# Patient Record
Sex: Male | Born: 1935 | Race: White | Hispanic: No | Marital: Married | Smoking: Never smoker
Health system: Southern US, Community
[De-identification: ages and names within clinical notes are randomized; demographics above are authoritative.]

## PROBLEM LIST (undated history)

## (undated) DIAGNOSIS — I639 Cerebral infarction, unspecified: Secondary | ICD-10-CM

---

## 2020-02-25 ENCOUNTER — Encounter (HOSPITAL_COMMUNITY): Payer: Self-pay | Admitting: Emergency Medicine

## 2020-02-25 ENCOUNTER — Emergency Department (HOSPITAL_COMMUNITY): Payer: Medicare Other

## 2020-02-25 ENCOUNTER — Inpatient Hospital Stay (HOSPITAL_COMMUNITY)
Admission: EM | Admit: 2020-02-25 | Discharge: 2020-02-28 | DRG: 177 | Disposition: A | Discharge status: Skilled Nursing Facility | Payer: Medicare Other | Source: Skilled Nursing Facility | Attending: Internal Medicine | Admitting: Internal Medicine

## 2020-02-25 ENCOUNTER — Other Ambulatory Visit: Payer: Self-pay

## 2020-02-25 DIAGNOSIS — Z79899 Other long term (current) drug therapy: Secondary | ICD-10-CM

## 2020-02-25 DIAGNOSIS — R64 Cachexia: Secondary | ICD-10-CM | POA: Diagnosis present

## 2020-02-25 DIAGNOSIS — I10 Essential (primary) hypertension: Secondary | ICD-10-CM | POA: Diagnosis present

## 2020-02-25 DIAGNOSIS — N400 Enlarged prostate without lower urinary tract symptoms: Secondary | ICD-10-CM | POA: Diagnosis present

## 2020-02-25 DIAGNOSIS — R9431 Abnormal electrocardiogram [ECG] [EKG]: Secondary | ICD-10-CM | POA: Diagnosis not present

## 2020-02-25 DIAGNOSIS — R633 Feeding difficulties: Secondary | ICD-10-CM | POA: Diagnosis present

## 2020-02-25 DIAGNOSIS — D649 Anemia, unspecified: Secondary | ICD-10-CM | POA: Diagnosis present

## 2020-02-25 DIAGNOSIS — I69391 Dysphagia following cerebral infarction: Secondary | ICD-10-CM | POA: Diagnosis not present

## 2020-02-25 DIAGNOSIS — R1310 Dysphagia, unspecified: Secondary | ICD-10-CM | POA: Diagnosis present

## 2020-02-25 DIAGNOSIS — J189 Pneumonia, unspecified organism: Secondary | ICD-10-CM | POA: Diagnosis not present

## 2020-02-25 DIAGNOSIS — R54 Age-related physical debility: Secondary | ICD-10-CM | POA: Diagnosis present

## 2020-02-25 DIAGNOSIS — C61 Malignant neoplasm of prostate: Secondary | ICD-10-CM | POA: Diagnosis present

## 2020-02-25 DIAGNOSIS — Z681 Body mass index (BMI) 19 or less, adult: Secondary | ICD-10-CM | POA: Diagnosis not present

## 2020-02-25 DIAGNOSIS — W06XXXA Fall from bed, initial encounter: Secondary | ICD-10-CM | POA: Diagnosis present

## 2020-02-25 DIAGNOSIS — R739 Hyperglycemia, unspecified: Secondary | ICD-10-CM | POA: Diagnosis present

## 2020-02-25 DIAGNOSIS — Z20822 Contact with and (suspected) exposure to covid-19: Secondary | ICD-10-CM | POA: Diagnosis present

## 2020-02-25 DIAGNOSIS — Z951 Presence of aortocoronary bypass graft: Secondary | ICD-10-CM

## 2020-02-25 DIAGNOSIS — A419 Sepsis, unspecified organism: Secondary | ICD-10-CM

## 2020-02-25 DIAGNOSIS — R809 Proteinuria, unspecified: Secondary | ICD-10-CM | POA: Diagnosis present

## 2020-02-25 DIAGNOSIS — J690 Pneumonitis due to inhalation of food and vomit: Principal | ICD-10-CM | POA: Diagnosis present

## 2020-02-25 DIAGNOSIS — E43 Unspecified severe protein-calorie malnutrition: Secondary | ICD-10-CM | POA: Diagnosis present

## 2020-02-25 DIAGNOSIS — I252 Old myocardial infarction: Secondary | ICD-10-CM | POA: Diagnosis not present

## 2020-02-25 DIAGNOSIS — I4891 Unspecified atrial fibrillation: Secondary | ICD-10-CM | POA: Diagnosis present

## 2020-02-25 DIAGNOSIS — I493 Ventricular premature depolarization: Secondary | ICD-10-CM | POA: Diagnosis present

## 2020-02-25 DIAGNOSIS — I2510 Atherosclerotic heart disease of native coronary artery without angina pectoris: Secondary | ICD-10-CM | POA: Diagnosis present

## 2020-02-25 DIAGNOSIS — R531 Weakness: Secondary | ICD-10-CM | POA: Diagnosis not present

## 2020-02-25 DIAGNOSIS — I255 Ischemic cardiomyopathy: Secondary | ICD-10-CM | POA: Diagnosis present

## 2020-02-25 DIAGNOSIS — E785 Hyperlipidemia, unspecified: Secondary | ICD-10-CM | POA: Diagnosis present

## 2020-02-25 DIAGNOSIS — I69351 Hemiplegia and hemiparesis following cerebral infarction affecting right dominant side: Secondary | ICD-10-CM | POA: Diagnosis not present

## 2020-02-25 DIAGNOSIS — Z955 Presence of coronary angioplasty implant and graft: Secondary | ICD-10-CM | POA: Diagnosis not present

## 2020-02-25 DIAGNOSIS — R972 Elevated prostate specific antigen [PSA]: Secondary | ICD-10-CM | POA: Diagnosis present

## 2020-02-25 HISTORY — DX: Cerebral infarction, unspecified: I63.9

## 2020-02-25 LAB — CBC
HCT: 41.3 % (ref 39.0–52.0)
Hemoglobin: 13.4 g/dL (ref 13.0–17.0)
MCH: 33.3 pg (ref 26.0–34.0)
MCHC: 32.4 g/dL (ref 30.0–36.0)
MCV: 102.7 fL — ABNORMAL HIGH (ref 80.0–100.0)
Platelets: 218 10*3/uL (ref 150–400)
RBC: 4.02 MIL/uL — ABNORMAL LOW (ref 4.22–5.81)
RDW: 13 % (ref 11.5–15.5)
WBC: 14.5 10*3/uL — ABNORMAL HIGH (ref 4.0–10.5)
nRBC: 0 % (ref 0.0–0.2)

## 2020-02-25 LAB — BASIC METABOLIC PANEL
Anion gap: 11 (ref 5–15)
BUN: 32 mg/dL — ABNORMAL HIGH (ref 8–23)
CO2: 26 mmol/L (ref 22–32)
Calcium: 9.3 mg/dL (ref 8.9–10.3)
Chloride: 105 mmol/L (ref 98–111)
Creatinine, Ser: 1.15 mg/dL (ref 0.61–1.24)
GFR calc Af Amer: >60 mL/min (ref >60)
GFR calc non Af Amer: 55 mL/min — ABNORMAL LOW (ref >60)
Glucose, Bld: 123 mg/dL — ABNORMAL HIGH (ref 70–99)
Potassium: 4.5 mmol/L (ref 3.5–5.1)
Sodium: 142 mmol/L (ref 135–145)

## 2020-02-25 LAB — CBG MONITORING, ED: Glucose-Capillary: 82 mg/dL (ref 70–99)

## 2020-02-25 LAB — SARS CORONAVIRUS 2 BY RT PCR (HOSPITAL ORDER, PERFORMED IN ~~LOC~~ HOSPITAL LAB): SARS Coronavirus 2: NEGATIVE

## 2020-02-25 LAB — POC OCCULT BLOOD, ED: Fecal Occult Bld: NEGATIVE

## 2020-02-25 LAB — LACTIC ACID, PLASMA: Lactic Acid, Venous: 2.1 mmol/L (ref 0.5–1.9)

## 2020-02-25 MED ORDER — SODIUM CHLORIDE 0.9 % IV BOLUS
500.0000 mL | Freq: Once | INTRAVENOUS | Status: AC
  Administered 2020-02-25: 500 mL via INTRAVENOUS

## 2020-02-25 MED ORDER — LACTATED RINGERS IV BOLUS
500.0000 mL | Freq: Once | INTRAVENOUS | Status: AC
  Administered 2020-02-25: 500 mL via INTRAVENOUS

## 2020-02-25 MED ORDER — SPIRONOLACTONE 12.5 MG HALF TABLET: 12.5000 mg | ORAL_TABLET | Freq: Every day | ORAL | Status: DC

## 2020-02-25 MED ORDER — THIAMINE HCL 100 MG/ML IJ SOLN
100.0000 mg | Freq: Every day | INTRAMUSCULAR | Status: DC
  Administered 2020-02-25 – 2020-02-28 (×4): 100 mg via INTRAVENOUS
  Filled 2020-02-25 (×4): qty 2

## 2020-02-25 MED ORDER — METOPROLOL TARTRATE 25 MG/10 ML ORAL SUSPENSION
12.5000 mg | Freq: Two times a day (BID) | ORAL | Status: DC
  Administered 2020-02-26 – 2020-02-28 (×5): 12.5 mg via ORAL
  Filled 2020-02-25 (×5): qty 10

## 2020-02-25 MED ORDER — ENOXAPARIN SODIUM 40 MG/0.4ML ~~LOC~~ SOLN
40.0000 mg | SUBCUTANEOUS | Status: DC
  Administered 2020-02-25 – 2020-02-27 (×3): 40 mg via SUBCUTANEOUS
  Filled 2020-02-25 (×3): qty 0.4

## 2020-02-25 MED ORDER — SPIRONOLACTONE 12.5 MG HALF TABLET
12.5000 mg | ORAL_TABLET | Freq: Every day | ORAL | Status: DC
  Administered 2020-02-26 – 2020-02-28 (×3): 12.5 mg via ORAL
  Filled 2020-02-25 (×3): qty 1

## 2020-02-25 MED ORDER — SODIUM CHLORIDE 0.9 % IV SOLN: 2.0000 g | INTRAVENOUS | Status: DC

## 2020-02-25 MED ORDER — METOPROLOL SUCCINATE ER 25 MG PO TB24: 25.0000 mg | ORAL_TABLET | Freq: Every day | ORAL | Status: DC

## 2020-02-25 MED ORDER — ADULT MULTIVITAMIN W/MINERALS CH
1.0000 | ORAL_TABLET | Freq: Every day | ORAL | Status: DC
  Administered 2020-02-25 – 2020-02-28 (×4): 1 via ORAL
  Filled 2020-02-25 (×4): qty 1

## 2020-02-25 MED ORDER — ACETAMINOPHEN 650 MG RE SUPP: 650.0000 mg | Freq: Four times a day (QID) | RECTAL | Status: DC | PRN

## 2020-02-25 MED ORDER — SACUBITRIL-VALSARTAN 24-26 MG PO TABS
1.0000 | ORAL_TABLET | Freq: Two times a day (BID) | ORAL | Status: DC
  Administered 2020-02-26 – 2020-02-28 (×5): 1 via ORAL
  Filled 2020-02-25 (×6): qty 1

## 2020-02-25 MED ORDER — ACETAMINOPHEN 325 MG PO TABS
650.0000 mg | ORAL_TABLET | Freq: Four times a day (QID) | ORAL | Status: DC | PRN
  Administered 2020-02-28: 650 mg via ORAL
  Filled 2020-02-25: qty 2

## 2020-02-25 MED ORDER — SACUBITRIL-VALSARTAN 24-26 MG PO TABS: 1.0000 | ORAL_TABLET | Freq: Two times a day (BID) | ORAL | Status: DC

## 2020-02-25 MED ORDER — SODIUM CHLORIDE 0.9 % IV SOLN
500.0000 mg | Freq: Once | INTRAVENOUS | Status: AC
  Administered 2020-02-25: 500 mg via INTRAVENOUS
  Filled 2020-02-25: qty 500

## 2020-02-25 MED ORDER — SODIUM CHLORIDE 0.9 % IV SOLN
3.0000 g | Freq: Once | INTRAVENOUS | Status: AC
  Administered 2020-02-25: 3 g via INTRAVENOUS
  Filled 2020-02-25: qty 8

## 2020-02-25 MED ORDER — INSULIN ASPART 100 UNIT/ML ~~LOC~~ SOLN
0.0000 [IU] | Freq: Three times a day (TID) | SUBCUTANEOUS | Status: DC
  Administered 2020-02-26: 2 [IU] via SUBCUTANEOUS
  Administered 2020-02-27 – 2020-02-28 (×2): 1 [IU] via SUBCUTANEOUS

## 2020-02-25 MED ORDER — FOLIC ACID 5 MG/ML IJ SOLN
1.0000 mg | Freq: Every day | INTRAMUSCULAR | Status: DC
  Administered 2020-02-25 – 2020-02-28 (×4): 1 mg via INTRAVENOUS
  Filled 2020-02-25 (×6): qty 0.2

## 2020-02-25 MED ORDER — SODIUM CHLORIDE 0.9 % IV SOLN
3.0000 g | Freq: Four times a day (QID) | INTRAVENOUS | Status: DC
  Administered 2020-02-25 – 2020-02-28 (×10): 3 g via INTRAVENOUS
  Filled 2020-02-25: qty 8
  Filled 2020-02-25: qty 3
  Filled 2020-02-25: qty 8
  Filled 2020-02-25 (×2): qty 3
  Filled 2020-02-25: qty 8
  Filled 2020-02-25: qty 3
  Filled 2020-02-25 (×2): qty 8
  Filled 2020-02-25: qty 3
  Filled 2020-02-25 (×2): qty 8
  Filled 2020-02-25 (×2): qty 3
  Filled 2020-02-25: qty 8

## 2020-02-25 MED ORDER — DEXTROSE 5 % IV SOLN
250.0000 mg | INTRAVENOUS | Status: DC
  Administered 2020-02-26 – 2020-02-28 (×2): 250 mg via INTRAVENOUS
  Filled 2020-02-25 (×4): qty 250

## 2020-02-25 NOTE — ED Notes (Signed)
Daughter Opal Sidles, would like an update [phone]

## 2020-02-25 NOTE — H&P (Signed)
Date: 02/26/2020               Patient Name:  Stephen Shepherd MRN: [id number]  DOB: [birth year] Age / Sex: 86 y.o., male   PCP: Patient, No Pcp Per         Medical Service: Internal Medicine Teaching Service         Attending Physician: Dr. Sid Falcon, MD    First Contact: Dr. Candie Chroman Pager: [phone]  Second Contact: Dr. 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 He presents to the ED with his daughter who assisted in providing the information as follows.   The patient was in his bed today and slid out of his bed while attempting to stand up. He notes this was due to his weakness and denies dizziness, palpitations, chest pain, loss of consciousness. He notes landing on his knees and denies hitting his head. He denies pain anywhere. He notes this event was unwitnessed. His wife called 32.  The patient notes over the past 6 months he has had generalized weakness. His daughter states the patient was performing his own ADL's 6 weeks ago and used driving as an example of this. He recently started using a walker to assist him in ambulating, which he was not using prior to 6 weeks ago. He also notes worsening of his dysphagia over the past year that is residual from his prior stroke. He has only been able to tolerate thin liquids and ensure drinks. Whenever he tries to eat anything, take his medications, or drink thick  liquids, he has coughing spells and is unable to tolerate them. He notes the cough is chronic and only associated with PO intake. The patient's daughter endorses worsening of the coughing spells. He has also had recent dental work done which has contributed to his inability to eat. He had teeth pulled and was unable to have dentures placed due to his weight loss complicating this procedure. The patient notes he is currently able to swallow saliva without difficulties. He also endorses loss of appetite. He was living in [place] receiving his medical care through [facility]. He recently moved to [place] 2 weeks ago and lives at an independent living facility with his wife.   The patient/his daughter also endorse a 40 lb weight loss of the past year. Per past medical records patient was 132 lbs 12/04/2019, there prior records noted 145 lbs in 2018. The patient's daughter states a full work up was done for the weight loss along with imaging that was found to be negative. In reviewing the patient's notes from [facility], he has a history of untreated adenocarcinoma of the prostate. The patient was offered treatment but declined according to his notes from 11/2019. When the patient was asked about a history of prostate disease or prostate cancer he denied having history either, his daughter then noted that she found in  a note that the patient had a history of adenocarcinoma of the prostate.  Denies any headache, shortness of breath, abdominal pain, nausea/vomiting. His daughter denies any changes in the patient's mental status.  Meds:  Current Meds  Medication Sig  . Cholecalciferol 25 MCG (1000 UT) tablet Take 1,000 Units by mouth daily.   Marland Kitchen ENTRESTO 24-26 MG Take 1 tablet by mouth 2 (two) times daily.  . metoprolol succinate (TOPROL-XL) 25 MG 24 hr tablet Take 25 mg by mouth daily.   . Multiple Vitamins-Minerals (MULTIVITAMIN ADULTS 50+) TABS Take 1 tablet by mouth daily.  . simvastatin (ZOCOR)  40 MG tablet Take 40 mg by mouth daily.   Marland Kitchen spironolactone (ALDACTONE) 25 MG tablet Take 12.5 mg by mouth daily.    Allergies: Allergies as of 02/25/2020  . (No Known Allergies)   PMHx: CAD s/p CABG in 2004, LIMA to LAD, SVG to D1, SVG to RPDA & SVG to ramus -Postoperative exercise sestamibi in 07/2003 showed moderate posterior ischemia and an inferior and posterior scar.  -Recent stress showed a large incomplete inferior infarction extending into the inferolateral wall associated with mild peri-infarction ischemia inferolaterally. Compared to the prior study of 01/2011, done with exercise, the inferior wall appears more infarcted and the posterior ischemia is similar.  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 Minimal likely due to BPH. He is going to see Dr. Barbaraann Rondo next week. 04/03/05: He was seen by Dr. Barbaraann Rondo. He felt exam was normal and there was no enlargement or  nodule. No further eval recommended with him.  05/25/06:eval today was NL,PSA rechecked,last one done was 6.5 ?06/09/07: Has been followed by Dr. Barbaraann Rondo after prostate biopsy showed adenocarcinoma,  they are electing to observe closely with serial PSA levels and exams. No urinary complaints. ?07/28/07: To see Dr. Jon Billings in Rad onc soon. PSA level is slightly lower this year 5.47. 09/12/08: seeing Dr. Jon Billings regularly and denies any symptoms, or change in urine stream, pain, hematuria. ?09/18/09: no symptoms to report, follwoing regularly with Dr. Jon Billings. ?10/27/10: doing well with no symptoms, closely follwoed by urology. ?10/29/11: he is now following up with Dr. Melton Alar in urology. ?06/26/13: will check PSA and he does not wish to do the DRE or follow  up with urology at thistime whi is reasonable given his age and comorbities. 12/25/13: last PSA was 13.98 06/2013. Will repeat this in 06/2014 at his next visit. He does not wish to see Urology again at this time, no symptoms, urinating well. ?06/27/14: he has no symptoms of of urge incontience or dribbling or pain. PSA ordered. He would like to follow this. ?02/04/15: has had PSA 21 and again discussed and defers on Uro follow up. 11/14/15: he would like to hear about the options from urology - will refer, last PSA 21. 11/18/16: I met him today for discussion of his situation at length including the prior diagnosis of Gleason 6 prostate cancer, the fact that he is clinically well 9 years later, the PSA progression with perhaps a 2-3 year doubling time, and the MRI/DRE evidence of some bulk of cancer.  In in truth, there are several paths that we could reasonably consider taking. One could make a case for further observation given  that he is 11 and remains clinically well without a particularly fast doubling time. Alternatively, one could make a case that he has a bulk of cancer and deserves treatment either ADT, radiation, or the combination. We discussed the fact that biopsy, even a limited biopsy of the nodule, could help [place] risk stratify between those 2 very different paths. After a good discussion, he was agreeable to consider this but would prefer to do so in the new year (e.g. early spring). That is a reasonable and quite well informed decision. They will additionally give [place] more information as we can monitor serial DRE as well as PSA. It seems likely that we will pursue a limited biopsy (e.g. 3 cores) subsequent to that visit. He is aware of a small but not 0 risk of metastatic progression during the interim. I provided my contact information to facilitate communication moving forward. It was a good discussion. Patient tells me today he is still thinking on this the last PSA was 23 01/2016 an he did not  want to repeat again today. He plans on calling Dr. Arvilla Meres. 05/17/17: he has not gone back, prefers to see his next PSA. He woud like to do this next visit in 6 months. 11/24/17: he defers on follow up, reviewed that PSA this year is 30. 10/23/19: will take this into consideration as a possible etiology of his wt loss and low energy if other work up is unrevealing.  12/04/19: he agrees to see oncology for this as perhaps nutrition referral will also be covered for his cancer diagnosis.   PSHx s/p left knee arthroscopy 1998 s/p TUR 1980s. s/p cervical spinal surgery for traumatic fracture 08/2012  Family History:  Patient notes not knowing his mother or fathers PMHx.   Social History:  Denies any tobacco use, alcohol use or illicit drug use  Review of Systems: A complete ROS was negative except as per HPI.   Patient's recent weights from [facility] notes  10/23/19 57.6 kg (127 lb)  10/05/19 57.2 kg (126 lb)  09/27/19 59 kg (130 lb)   Physical Exam: Blood pressure 116/90, pulse (!) 114, temperature 98.7 F (37.1 C), temperature source Oral, resp. rate (!) 31, SpO2 95 %. Physical Exam Vitals and nursing note reviewed.  Constitutional:      General: He is awake. He is not in acute distress.    Appearance: He is cachectic. He is ill-appearing. He is not toxic-appearing or diaphoretic.  HENT:     Head: Normocephalic and atraumatic.     Mouth/Throat:     Mouth: Mucous membranes are dry.     Pharynx: No oropharyngeal exudate or posterior oropharyngeal erythema.  Eyes:     Extraocular Movements: Extraocular movements intact.     Pupils: Pupils are equal, round, and reactive to light.     Comments: Coloboma of left eye  Cardiovascular:     Rate and Rhythm: Normal rate. Rhythm irregular.     Heart sounds: No murmur heard.  No friction rub. No gallop.   Pulmonary:     Effort: Pulmonary effort is normal. No respiratory distress.     Breath sounds: Normal breath sounds. No stridor. No  wheezing, rhonchi or rales.  Abdominal:     General: Abdomen is flat. There is no distension.     Tenderness: There is no abdominal tenderness.  Musculoskeletal:        General: No swelling or tenderness.     Right lower leg: No edema.  Left lower leg: No edema.  Skin:    Coloration: Skin is pale.     Findings: No rash.  Neurological:     General: No focal deficit present.     Mental Status: He is alert and oriented to person, place, and time.     Cranial Nerves: No cranial nerve deficit.     Sensory: No sensory deficit.     Motor: Weakness (weakness of right upper and lower extremity 4/5. ) present.  Psychiatric:        Mood and Affect: Mood normal.        Behavior: Behavior normal. Behavior is cooperative.    EKG:  EKG in ED at 11:48 am on 02/25/2020 - Tachycardic, irregular rhythm. p waves present, lengthening PR intervals with PAC's.   No prior EKG for me to interpret, but patient's cardiology office note has interpretation of EKG from 01/04/20 reveals NSR, LAD, PACs, poor R wave progression, PVC, and NSSTWA.   CXR:  IMPRESSION: Right lower lobe opacity concerning for pneumonia. Follow-up radiographs are recommended to ensure resolution.  TTE from 07/27/2019 LVEF 37%, diffusely hypokinetic with regional variation. No LV thrombus. LA mildly dilated. AV tricuspid. Mild/mod Stephen. Trace TR.  Pharm MIBI 10/05/2019: large incomplete inferior infarction extending into the inferolateral wall associated with mild peri-infarction ischemia inferolaterally (posteriorly). The LVEF is 33% with paradoxical septal motion consistent with prior cardiac surgery, as well as diffuse hypokinesis. There is mild LV cavity dilatation at rest. Compared to the prior study of 01/2011, done with exercise, the inferior wall appears more infarcted and the posterior ischemia is similar. The LVEF has decreased (LVEF 45% prior). Today's LVEF may be an underestimate due to gating difficulties related to frequent  ectopy (APC, VPC, Complex VEA: Couplets; Bi/Tri Gemini)    Assessment & Plan by Problem: Principal Problem:   Aspiration pneumonia 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 The patient has had a prior workup for weight loss and was supposed to follow up with oncology, per daughter, his dysphagia has never been worked up. Patient also found to have right lower lobe pneumonia per chest x-ray. Will admit patient for further evaluation of his weakness and dysphagia as well as treatment of his pneumonia.  #Weakness - Patient with worsening weakness over the past 6 months, no longer able to perform his ADL's and started walking with a walker - Cachetic on appearance - Suspected etiology of malnutrition vs worsening of adenocarcinoma of prostate - New diagnosis of pneumonia may also be contributing to  - Speech ordered to evaluate dysphagia - PT/OT ordered to evaluate weakness - Nutrition consult ordered  #Dysphagia - Patient with history of dysphagia since his CVA that he notes has been worsening - Patient notes coughing spells with PO intake - Daughter notes workup has not been done for his dysphagia - Speech consult ordered, appreciate their assistance and will follow up with their recommendations - Suspect may be causing malnutrition contributing to the patient's weakness - Will keep NPO at this time until evaluated by speech, meds will be crushed/converted to PO equivalence  - Multivitamin with  minerals ordered, 1 tab daily, will start once patient is no longer NPO - Thiamine B-1 100 mg IV daily as well as folic acid  1 mg IV daily - VB12 and Folate ordered - Due to NPO status, hypoglycemic protocol ordered  #Suspected Aspiration Pneumonia - Right lower lobe opacity concerning for pneumonia seen on physical exam - Patient denies symptoms of shortness of breath, chest pain, fever, chills. Denies worsening of chronic cough with meals - WBC of 14.5, suspect pneumonia is source of leukocytosis. Will trend with daily CBC's - Lactic acid of 2.1. Suspect combination of dehydration and pneumonia. LR 500 mL ordered. - Blood cultures x 2 ordered - Urinalysis and cultures ordered - Suspect aspiration pneumonia as patient coughs with PO intake.  - Will cover for CAP as well as for anaerobic coverage - Continue Unasyn 3 g IV Q6  Day 1/5 - Azithromycin 500 MG IV once and then azithro 250 mg IV for 4 days, Day 1/5 - Acetaminophen 650 mg tab or suppository ordered for any fever - Dysphagia workup as per above  #Adenocarcinoma of the prostate  - Patient with history of adenocarcinoma of the prostate - Most recent PSA of 30 that has been trending up since 2006. Patient with history of deferring urology follow up. - Patient was supposed to follow up with urology outpatient while in [place], but he never followed up with them nor oncology.  - Denies any urinary symptoms - Patient will need to follow up with urology on an outpatient basis, do not believe in hospital consult is indicated at this time  #Ischemic Cardiomyopathy - History of HFrEF, last EF 37% TEE 07/2019, Hx of NYHA Class III - Denies signs/symptoms of CHF exacerbation - Will continue to monitor fluid status, daily weights and strict I/O's. - Continue home medications of; Entresto 24-26 1 tab BID, will give crushed Metoprolol Succinate XL 25 mg daily switched to oral suspension 12.5 BID due to NPO status Sprionolactone 12.5 mg daily, will administer crushed  #Abnormal EKG - Patient found to have PAC's with LAD on EKG in the ED - Patient noted to  have prior PAC's from previous cardiologist's notes.  - There plan was to up-titrate BB as tolerated during future visits - Patient will need to follow up with cardiology on outpatient basis. - Patient to continue metoprolol while admitted  Hx of CAD - CABG in 2004 - Continue metoprolol 25 mg total daily as per above - Will order aspirin 81 mg daily, simvastatin 40 mg daily after speech evaluation  Hx of HTN - BP of 116/90 - Continue HF meds as per above - Will continue to montior BP and administer additional anti-hypertensives as need be  Hyperglycemia - Glucose of 123 - No Hx of diabetes per our records, last A1C at cardiologist in [place] from 07/21 was 5.8. - Novolog sliding scale ordered for any further episodes of hyperglycemia  Hx of Anemia - Hgb of 13.4, MCV of 102.7 - Due to patient's malnutrition will order VB12 and folate - Will supplement as needed  Diet: NPO, sips with meds only DVT Prophylaxis: Lovenox 40 mg daily Code Status: Full Code  Dispo: Admit patient to Inpatient with expected length of stay greater than 2 midnights.  SignedRiesa Pope, MD 02/26/2020, 12:04 AM  After 5pm on weekdays and 1pm on weekends: On Call pager: [phone]

## 2020-02-25 NOTE — ED Triage Notes (Signed)
Pt to triage via [place] from [facility].  PTAR initially called out for fall.  Pt found on knees at bed in praying position.  Pt with R sided weakness when they got him up but has history of R sided weakness from previous stroke.  Pt alert and oriented and states he didn't fall.  Pt reports generalized weakness that has been gradually getting worse over the past few weeks.  Pt reports difficulty swallowing.  Rhonchi to L upper lung.  BP initially 80/-.  Received NS 500cc.  BP 126/60.  18g L upper arm.

## 2020-02-25 NOTE — ED Provider Notes (Signed)
[facility] EMERGENCY DEPARTMENT Provider Note   CSN: [id number] Arrival date & time: 02/25/20  1148     History Chief Complaint  Patient presents with  . Weakness    [facility] is a 86 y.o. male.  Patient with history of stroke, CAD, heart failure who presents to the ED with weakness, deconditioning.  Patient recently moved down here from [place].  Living in independent living.  Recently had some issues with his lower teeth but has been having trouble with swallowing and aspiration recently.  Had a syncopal type event/was too weak to get up out of bed and slid down to the floor.  Did not hit his head or lose consciousness.  He is not on blood thinners.  Blood pressure was in the eighties systolic with EMS.  Patient was tachycardic as well.  When he arrived to triage blood pressure had improved and patient went to the waiting room.  Temperature 99.4 rectally.  Denies any chest pain, shortness of breath.  No abdominal pain.  States that he is too weak to take care of himself.  The history is provided by the patient.  Weakness Severity:  Moderate Onset quality:  Gradual Timing:  Constant Progression:  Worsening Chronicity:  New Relieved by:  Nothing Worsened by:  Nothing Associated symptoms: cough   Associated symptoms: no abdominal pain, no arthralgias, no chest pain, no dysuria, no fever, no seizures, no shortness of breath and no vomiting        Past Medical History:  Diagnosis Date  . Stroke [facility])     There are no problems to display for this patient.    No family history on file.  Social History   Tobacco Use  . Smoking status: Not on file  Substance Use Topics  . Alcohol use: Not on file  . Drug use: Not on file    Home Medications Prior to Admission medications   Medication Sig Start Date End Date Taking? Authorizing Provider  Cholecalciferol 25 MCG (1000 UT) tablet Take 1,000 Units by mouth daily.    Yes [provider]    ENTRESTO 24-26 MG Take 1 tablet by mouth 2 (two) times daily. 01/10/20  Yes [provider]  metoprolol succinate (TOPROL-XL) 25 MG 24 hr tablet Take 25 mg by mouth daily.  09/08/19  Yes [provider]  Multiple Vitamins-Minerals (MULTIVITAMIN ADULTS 50+) TABS Take 1 tablet by mouth daily.   Yes [provider]  simvastatin (ZOCOR) 40 MG tablet Take 40 mg by mouth daily.  09/08/19  Yes [provider]  spironolactone (ALDACTONE) 25 MG tablet Take 12.5 mg by mouth daily.  01/26/20  Yes [provider]    Allergies    Patient has no known allergies.  Review of Systems   Review of Systems  Constitutional: Positive for activity change, appetite change and fatigue. Negative for chills and fever.  HENT: Negative for ear pain and sore throat.   Eyes: Negative for pain and visual disturbance.  Respiratory: Positive for cough. Negative for shortness of breath.   Cardiovascular: Negative for chest pain and palpitations.  Gastrointestinal: Negative for abdominal pain and vomiting.  Genitourinary: Negative for dysuria and hematuria.  Musculoskeletal: Negative for arthralgias and back pain.  Skin: Negative for color change and rash.  Neurological: Positive for weakness. Negative for seizures and syncope.  All other systems reviewed and are negative.   Physical Exam Updated Vital Signs  ED Triage Vitals  Enc Vitals Group  BP 02/25/20 1152 126/74     Pulse Rate 02/25/20 1152 (!) 108     Resp 02/25/20 1152 14     Temp 02/25/20 1152 97.7 F (36.5 C)     Temp Source 02/25/20 1152 Oral     SpO2 02/25/20 1152 97 %     Weight --      Height --      Head Circumference --      Peak Flow --      Pain Score 02/25/20 1150 0     Pain Loc --      Pain Edu? --      Excl. in [place]? --     Physical Exam Vitals and nursing note reviewed.  Constitutional:      Appearance: He is well-developed. He is cachectic.  HENT:     Head: Normocephalic and  atraumatic.     Mouth/Throat:     Mouth: Mucous membranes are dry.     Comments: Mucous membranes extremely dry Eyes:     Extraocular Movements: Extraocular movements intact.     Conjunctiva/sclera: Conjunctivae normal.     Pupils: Pupils are equal, round, and reactive to light.  Cardiovascular:     Rate and Rhythm: Normal rate and regular rhythm.     Pulses: Normal pulses.     Heart sounds: Normal heart sounds. No murmur heard.   Pulmonary:     Effort: Pulmonary effort is normal. No respiratory distress.     Breath sounds: Normal breath sounds.  Abdominal:     Palpations: Abdomen is soft.     Tenderness: There is no abdominal tenderness.  Genitourinary:    Rectum: Normal.     Comments: Brown stool on exam but does have some dried feces on his legs Musculoskeletal:     Cervical back: Neck supple.  Skin:    General: Skin is warm and dry.     Capillary Refill: Capillary refill takes less than 2 seconds.  Neurological:     General: No focal deficit present.     Mental Status: He is alert. Mental status is at baseline.     Comments: Some right-sided weakness which seems baseline otherwise neurologically intact, normal sensation, normal speech     ED Results / Procedures / Treatments   Labs (all labs ordered are listed, but only abnormal results are displayed) Labs Reviewed  BASIC METABOLIC PANEL - Abnormal; Notable for the following components:      Result Value   Glucose, Bld 123 (*)    BUN 32 (*)    GFR calc non Af Amer 55 (*)    All other components within normal limits  CBC - Abnormal; Notable for the following components:   WBC 14.5 (*)    RBC 4.02 (*)    MCV 102.7 (*)    All other components within normal limits  LACTIC ACID, PLASMA - Abnormal; Notable for the following components:   Lactic Acid, Venous 2.1 (*)    All other components within normal limits  SARS CORONAVIRUS 2 BY RT PCR (HOSPITAL ORDER, [place] [facility])  CULTURE, BLOOD  (ROUTINE X 2)  CULTURE, BLOOD (ROUTINE X 2)  URINE CULTURE  URINALYSIS, ROUTINE W REFLEX MICROSCOPIC  LACTIC ACID, PLASMA  HEPATIC FUNCTION PANEL  POC OCCULT BLOOD, ED    EKG EKG Interpretation  Date/Time:  Sunday February 25 2020 11:48:58 EDT Ventricular Rate:  113 PR Interval:    QRS Duration: 104 QT Interval:  286 QTC  Calculation: 392 R Axis:   93 Text Interpretation: Sinus tachycardia with 1st degree A-V block with Premature supraventricular complexes Rightward axis Minimal voltage criteria for LVH, may be normal variant ( Cornell product ) Confirmed by Lennice Sites [phone]) on 02/25/2020 4:33:43 PM   Radiology DG Chest 2 View  Result Date: 02/25/2020 CLINICAL DATA:  Shortness of breath. EXAM: CHEST - 2 VIEW COMPARISON:  None. FINDINGS: The heart size and mediastinal contours are within normal limits. No pneumothorax or pleural effusion is noted. Sternotomy wires are noted. Hyperinflation of the lungs is noted. Left-sided calcified pleural plaque is noted consistent with prior asbestos exposure. Right lower lobe opacity is noted concerning for pneumonia. The visualized skeletal structures are unremarkable. IMPRESSION: Right lower lobe opacity concerning for pneumonia. Follow-up radiographs are recommended to ensure resolution. Electronically Signed   By: Marijo Conception M.D.   On: 02/25/2020 13:45    Procedures .Critical Care Performed by: Lennice Sites, DO Authorized by: Lennice Sites, DO   Critical care provider statement:    Critical care time (minutes):  45   Critical care was necessary to treat or prevent imminent or life-threatening deterioration of the following conditions:  Sepsis   Critical care was time spent personally by me on the following activities:  Discussions with consultants, evaluation of patient's response to treatment, examination of patient, ordering and performing treatments and interventions, ordering and review of laboratory studies, ordering and  review of radiographic studies, pulse oximetry, re-evaluation of patient's condition, obtaining history from patient or surrogate and review of old charts   (including critical care time)  Medications Ordered in ED Medications  sodium chloride 0.9 % bolus 500 mL (0 mLs Intravenous Stopped 02/25/20 1902)  Ampicillin-Sulbactam (UNASYN) 3 g in sodium chloride 0.9 % 100 mL IVPB (0 g Intravenous Stopped 02/25/20 1902)    ED Course  I have reviewed the triage vital signs and the nursing notes.  Pertinent labs & imaging results that were available during my care of the patient were reviewed by me and considered in my medical decision making (see chart for details).    MDM Rules/Calculators/A&P                          Stephen Shepherd is a 86 year old male with history of stroke, CAD, heart failure who presents the ED with generalized weakness, difficulty with swallowing and concern for aspiration.  Had a syncopal type event today which she slid out of bed.  Did not hit his head or lose consciousness.  Patient not on blood thinners.  Neurologically he is at his baseline.  Vital signs with EMS were significant for hypotension with blood pressure in the eighties, heart rate in the low one hundreds.  He was given 500 cc of IV fluids and by the time that the triage blood pressure had improved and he has been in the waiting room.  Blood work was significant for leukocytosis of 14.  Chest x-ray concerning for right lower lobe pneumonia likely consistent with aspiration given that he has been having trouble with eating and chewing difficulty with swallowing per family.  Could be due to his lower teeth being pulled recently but also there has been some swallowing issues before that.  Overall patient is cachectic, does not appear that he is taking care of himself well.  He lives in independent living.  Recently moved here from [place].  Overall vital signs are stable at this time.  But believe  he would benefit from  admission with IV fluids, IV antibiotics, nutrition and and swallow eval.  Will testing for coronavirus.  Will get remaining lab work including lactic acid, urinalysis, Covid testing.  He is vaccinated.  Neurologically appears stable.  No signs of head trauma.    Patient with lactic acid of 2.1.  Covid test is negative.  Overall suspect sepsis and will need this ruled out as he does have tachycardia and tachypnea with leukocytosis and pneumonia on xray.  He was hypotensive with EMS.  Has been hemodynamically improved here.  Will likely need long-term placement as well.  Patient is not safe to go back to independent living.  Will admit for further care.  This chart was dictated using voice recognition software.  Despite best efforts to proofread,  errors can occur which can change the documentation meaning.    Final Clinical Impression(s) / ED Diagnoses Final diagnoses:  Aspiration pneumonia of right lower lobe, unspecified aspiration pneumonia type ([place])  Sepsis, due to unspecified organism, unspecified whether acute organ dysfunction present [facility])    Rx / DC Orders ED Discharge Orders    None       Lennice Sites, DO 02/25/20 1927

## 2020-02-26 ENCOUNTER — Inpatient Hospital Stay (HOSPITAL_COMMUNITY): Payer: Medicare Other

## 2020-02-26 ENCOUNTER — Encounter (HOSPITAL_COMMUNITY): Payer: Self-pay | Admitting: Internal Medicine

## 2020-02-26 DIAGNOSIS — R531 Weakness: Secondary | ICD-10-CM

## 2020-02-26 DIAGNOSIS — J690 Pneumonitis due to inhalation of food and vomit: Principal | ICD-10-CM

## 2020-02-26 DIAGNOSIS — R1310 Dysphagia, unspecified: Secondary | ICD-10-CM

## 2020-02-26 DIAGNOSIS — I255 Ischemic cardiomyopathy: Secondary | ICD-10-CM

## 2020-02-26 DIAGNOSIS — R9431 Abnormal electrocardiogram [ECG] [EKG]: Secondary | ICD-10-CM

## 2020-02-26 DIAGNOSIS — R739 Hyperglycemia, unspecified: Secondary | ICD-10-CM

## 2020-02-26 DIAGNOSIS — C61 Malignant neoplasm of prostate: Secondary | ICD-10-CM

## 2020-02-26 DIAGNOSIS — I2510 Atherosclerotic heart disease of native coronary artery without angina pectoris: Secondary | ICD-10-CM

## 2020-02-26 DIAGNOSIS — E43 Unspecified severe protein-calorie malnutrition: Secondary | ICD-10-CM | POA: Insufficient documentation

## 2020-02-26 DIAGNOSIS — Z955 Presence of coronary angioplasty implant and graft: Secondary | ICD-10-CM

## 2020-02-26 LAB — COMPREHENSIVE METABOLIC PANEL
ALT: 20 U/L (ref 0–44)
AST: 26 U/L (ref 15–41)
Albumin: 2.3 g/dL — ABNORMAL LOW (ref 3.5–5.0)
Alkaline Phosphatase: 50 U/L (ref 38–126)
Anion gap: 8 (ref 5–15)
BUN: 30 mg/dL — ABNORMAL HIGH (ref 8–23)
CO2: 27 mmol/L (ref 22–32)
Calcium: 8.8 mg/dL — ABNORMAL LOW (ref 8.9–10.3)
Chloride: 107 mmol/L (ref 98–111)
Creatinine, Ser: 0.86 mg/dL (ref 0.61–1.24)
GFR calc Af Amer: >60 mL/min (ref >60)
GFR calc non Af Amer: >60 mL/min (ref >60)
Glucose, Bld: 91 mg/dL (ref 70–99)
Potassium: 4.2 mmol/L (ref 3.5–5.1)
Sodium: 142 mmol/L (ref 135–145)
Total Bilirubin: 1.3 mg/dL — ABNORMAL HIGH (ref 0.3–1.2)
Total Protein: 5.6 g/dL — ABNORMAL LOW (ref 6.5–8.1)

## 2020-02-26 LAB — CBC
HCT: 36.5 % — ABNORMAL LOW (ref 39.0–52.0)
Hemoglobin: 11.6 g/dL — ABNORMAL LOW (ref 13.0–17.0)
MCH: 32.4 pg (ref 26.0–34.0)
MCHC: 31.8 g/dL (ref 30.0–36.0)
MCV: 102 fL — ABNORMAL HIGH (ref 80.0–100.0)
Platelets: 202 10*3/uL (ref 150–400)
RBC: 3.58 MIL/uL — ABNORMAL LOW (ref 4.22–5.81)
RDW: 13 % (ref 11.5–15.5)
WBC: 8.6 10*3/uL (ref 4.0–10.5)
nRBC: 0 % (ref 0.0–0.2)

## 2020-02-26 LAB — URINALYSIS, ROUTINE W REFLEX MICROSCOPIC
Bilirubin Urine: NEGATIVE
Glucose, UA: NEGATIVE mg/dL
Hgb urine dipstick: NEGATIVE
Ketones, ur: 5 mg/dL — AB
Leukocytes,Ua: NEGATIVE
Nitrite: NEGATIVE
Protein, ur: 30 mg/dL — AB
Specific Gravity, Urine: 1.027 (ref 1.005–1.030)
pH: 5 (ref 5.0–8.0)

## 2020-02-26 LAB — URINE CULTURE: Culture: <10000 — AB

## 2020-02-26 LAB — FOLATE: Folate: 65.4 ng/mL (ref >5.9)

## 2020-02-26 LAB — HEPATIC FUNCTION PANEL
ALT: 19 U/L (ref 0–44)
AST: 25 U/L (ref 15–41)
Albumin: 2.4 g/dL — ABNORMAL LOW (ref 3.5–5.0)
Alkaline Phosphatase: 50 U/L (ref 38–126)
Bilirubin, Direct: 0.3 mg/dL — ABNORMAL HIGH (ref 0.0–0.2)
Indirect Bilirubin: 1.2 mg/dL — ABNORMAL HIGH (ref 0.3–0.9)
Total Bilirubin: 1.5 mg/dL — ABNORMAL HIGH (ref 0.3–1.2)
Total Protein: 5.8 g/dL — ABNORMAL LOW (ref 6.5–8.1)

## 2020-02-26 LAB — HEMOGLOBIN A1C
Hgb A1c MFr Bld: 5.4 % (ref 4.8–5.6)
Mean Plasma Glucose: 108.28 mg/dL

## 2020-02-26 LAB — GLUCOSE, CAPILLARY
Glucose-Capillary: 74 mg/dL (ref 70–99)
Glucose-Capillary: 74 mg/dL (ref 70–99)
Glucose-Capillary: 161 mg/dL — ABNORMAL HIGH (ref 70–99)
Glucose-Capillary: 103 mg/dL — ABNORMAL HIGH (ref 70–99)

## 2020-02-26 LAB — LACTIC ACID, PLASMA: Lactic Acid, Venous: 1.4 mmol/L (ref 0.5–1.9)

## 2020-02-26 LAB — VITAMIN B12: Vitamin B-12: 1047 pg/mL — ABNORMAL HIGH (ref 180–914)

## 2020-02-26 MED ORDER — SIMVASTATIN 20 MG PO TABS
40.0000 mg | ORAL_TABLET | Freq: Every day | ORAL | Status: DC
  Administered 2020-02-26 – 2020-02-27 (×2): 40 mg via ORAL
  Filled 2020-02-26 (×2): qty 2

## 2020-02-26 MED ORDER — DEXTROSE IN LACTATED RINGERS 5 % IV SOLN: INTRAVENOUS | Status: AC

## 2020-02-26 MED ORDER — ASPIRIN 81 MG PO CHEW
81.0000 mg | CHEWABLE_TABLET | Freq: Every day | ORAL | Status: DC
  Administered 2020-02-26 – 2020-02-28 (×3): 81 mg via ORAL
  Filled 2020-02-26 (×3): qty 1

## 2020-02-26 MED ORDER — ENSURE ENLIVE PO LIQD
237.0000 mL | Freq: Four times a day (QID) | ORAL | Status: DC
  Administered 2020-02-26 – 2020-02-28 (×6): 237 mL via ORAL
  Filled 2020-02-26 (×2): qty 237

## 2020-02-26 NOTE — Evaluation (Signed)
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  Clinical Impression   Pt presents with generalized weakness, impaired sitting and standing balance with heavy posterior bias, difficulty performing mobility tasks, and decreased activity tolerance vs baseline. Pt to benefit from acute PT to address deficits. Pt required mod-max +2 for bed mobility and transfer to recliner at bedside, pt unable to ambulate this day due to poor tolerance, LE weakness with buckling, and heavy posterior leaning difficult to correct even with multimodal cuing. At baseline, pt reports he lives with wife in ALF, and they both take care of themselves. PT recommending ST-SNF level of care post-acutely to improve pt strength and mobility, and reduce fall risk. PT to progress mobility as tolerated, and will continue to follow acutely.      Follow Up Recommendations SNF;Supervision/Assistance - 24 hour    Equipment Recommendations  None recommended by PT    Recommendations for Other Services       Precautions / Restrictions Precautions Precautions: Fall Restrictions Weight Bearing Restrictions: No      Mobility  Bed Mobility Overal bed mobility: Needs Assistance Bed Mobility: Supine to Sit     Supine to sit: HOB elevated;Max assist;+2 for safety/equipment     General bed mobility comments: Max +2 for trunk elevation and completing LE translation off of bed, pt initiated walking legs to EOB. HOB heavily  elevated  Transfers Overall transfer level: Needs assistance Equipment used: Rolling walker (2 wheeled) Transfers: Sit to/from [facility] Sit to Stand: Mod assist;+2 physical assistance Stand pivot transfers: Mod assist;+2 physical assistance       General transfer comment: Mod +2 for power up, steadying, correcting posterior bias, and slow eccentric lower into recliner.  Ambulation/Gait             General Gait Details: not attempted  Stairs            Wheelchair Mobility    Modified Rankin (Stroke Patients Only)       Balance Overall balance assessment: Needs assistance;History of Falls Sitting-balance support: Single extremity supported;Feet supported Sitting balance-Leahy Scale: Fair       Standing balance-Leahy Scale: Poor Standing balance comment: reliant on external assist                             Pertinent Vitals/Pain Pain Assessment: No/denies pain Pain Score: 0-No pain Faces Pain Scale: Hurts a little bit Pain Location: genealized Pain Descriptors / Indicators: Grimacing Pain Intervention(s): Limited activity within patient's tolerance;Monitored during session;Repositioned    Home Living Family/patient expects to be discharged to:: Assisted living               Home Equipment: Walker - 2 wheels;Bedside commode;Shower seat - built in;Grab bars - tub/shower;Grab bars - toilet      Prior Function Level of Independence: Independent with assistive device(s);Needs assistance   Gait / Transfers Assistance Needed: Uses RW to mobilize  ADL's / Homemaking Assistance Needed: Does his own  bathing/dressing; has meals delivered; he and his wife work together to help each other out        Hand Dominance   Dominant Hand: Left (since CVA 30 years ago, pt reports doing many tasks including eating with L hand)    Extremity/Trunk Assessment   Upper Extremity Assessment Upper Extremity Assessment: Defer to OT  evaluation RUE Deficits / Details: genealized weakness; limited shoulder flexion to @ 60; states he has weakness from previous CVA; able to use functionally RUE Coordination: decreased fine motor;decreased gross motor LUE Deficits / Details: generalized weakness    Lower Extremity Assessment Lower Extremity Assessment: Generalized weakness    Cervical / Trunk Assessment Cervical / Trunk Assessment: Kyphotic;Other exceptions Cervical / Trunk Exceptions: forward head, rounded shoulders. Cachetic appearance  Communication      Cognition Arousal/Alertness: Awake/alert Behavior During Therapy: WFL for tasks assessed/performed Overall Cognitive Status: No family/caregiver present to determine baseline cognitive functioning                                 General Comments: most likely close to baseline; HOH but pleasant and agreeable to mobility.      General Comments General comments (skin integrity, edema, etc.): SpO2 95-96 on RA, DOE 2/4 with mobility    Exercises     Assessment/Plan    PT Assessment Patient needs continued PT services  PT Problem List Decreased strength;Decreased mobility;Decreased activity tolerance;Decreased balance;Decreased knowledge of use of DME;Decreased safety awareness;Decreased coordination       PT Treatment Interventions DME instruction;Therapeutic activities;Gait training;Therapeutic exercise;Patient/family education;Balance training;Functional mobility training;Neuromuscular re-education    PT Goals (Current goals can be found in the Care Plan section)  Acute Rehab PT Goals Patient Stated Goal: to get stronger PT Goal Formulation: With patient Time For Goal Achievement: 03/11/20 Potential to Achieve Goals: Good    Frequency Min 2X/week   Barriers to discharge        Co-evaluation PT/OT/SLP Co-Evaluation/Treatment: Yes Reason for Co-Treatment: To address functional/ADL transfers;For patient/therapist safety PT goals  addressed during session: Mobility/safety with mobility;Balance         AM-PAC PT "6 Clicks" Mobility  Outcome Measure Help needed turning from your back to your side while in a flat bed without using bedrails?: A Lot Help needed moving from lying on your back to sitting on the side of a flat bed without using bedrails?: A Lot Help needed moving to and from a bed to a chair (including a wheelchair)?: Total Help needed standing up from a chair using your arms (e.g., wheelchair or bedside chair)?: A Lot Help needed to walk in hospital room?: A Lot Help needed climbing 3-5 steps with a railing? : Total 6 Click Score: 10    End of Session Equipment Utilized During Treatment: Gait belt Activity Tolerance: Patient limited by fatigue Patient left: in chair;with call bell/phone within reach;with chair alarm set Nurse Communication: Mobility status PT Visit Diagnosis: Other abnormalities of gait and mobility (R26.89);Muscle weakness (generalized) (M62.81)    Time: 3151-7616 PT Time Calculation (min) (ACUTE ONLY): 35 min   Charges:   PT Evaluation $PT Eval Low Complexity: 1 Low          Wilma Michaelson E, PT Acute Rehabilitation Services Pager [phone]  Office [phone]    Anajah Sterbenz D [place] Cookson 02/26/2020, 1:19 PM

## 2020-02-26 NOTE — Progress Notes (Signed)
Modified Barium Swallow Progress Note  Patient Details  Name: Adalbert Alberto MRN: [id number] Date of Birth: 1925/02/28  Today's Date: 02/26/2020  Modified Barium Swallow completed.  Full report located under Chart Review in the Imaging Section.  Brief recommendations include the following:  Clinical Impression  Patient presents with a moderate oropharyngeal dysphagia with structural component secondary to significant curvature of cervical vertebrae and resulting in vertebrae pushing into pharynx and causing narrowing of esophagus. Patient with premature spillage of liquids, decreased base of tongue strength, decreased laryngeal elevation and hyolaryngeal excursion as well as impaired airway closure, resulting in silent and sensed trace aspiration during swallow with thin liquids. Puree solids became briefly lodged in vallecular sinus and slowly transited with subsequent swallows and thin liquid sips. Nectar thick liquids resulted in increased vallecular and pyriform sinus residuals as compared to thin liquids. Patient was able to move portion of aspirate out of trachea and out of laryngeal vestibule when just below vocal cords, with throat clear. Patient cannot perform chin tuck do to prior surgery on cervical vertebrae.   Swallow Evaluation Recommendations       SLP Diet Recommendations: Thin liquid   Liquid Administration via: Cup   Medication Administration: Crushed with puree (liquid if possible)   Supervision: Patient able to self feed   Compensations: Minimize environmental distractions;Slow rate;Small sips/bites;Clear throat intermittently       Oral Care Recommendations: Oral care BID       Sonia Baller, MA, CCC-SLP Speech Therapy [facility] Acute Rehab Pager: [phone]

## 2020-02-26 NOTE — Progress Notes (Signed)
Initial Nutrition Assessment  DOCUMENTATION CODES:   Severe malnutrition in context of chronic illness  INTERVENTION:   Clear liquid diet per SLP, advance per SLP assessment  Ensure Enlive po QID, each supplement provides 350 kcal and 20 grams of protein  Encouraged pt to drink oral nutrition supplements as this will be primary source for kcal and protein on limited clear liquid diet.     NUTRITION DIAGNOSIS:   Severe Malnutrition related to chronic illness (dysphagia, cancer) as evidenced by percent weight loss, severe fat depletion, severe muscle depletion. 15% weight loss x 3 months  GOAL:   Patient will meet greater than or equal to 90% of their needs  MONITOR:   PO intake, Supplement acceptance  REASON FOR ASSESSMENT:   Consult Assessment of nutrition requirement/status  ASSESSMENT:   Pt with PMH of HF due to ischemic cardiomyopathy, CAD with MI s/p CABG, CVA with residual R sided weakness, dysphagia, HTN, and adenocarcinoma of the prostate (refused treatment in the past) who has had worsening weakness and dysphagia over the lst 6 months with weight loss. Pt has declined over last 6 months and no longer able to perform his ADL's. He recently moved from [place]  to [place] and is living in ALF with wife.   Spoke with pt and SLP who was at bedside. Pt confirmed history of weight loss due to poor appetite, problems swallowing and problems chewing due to missing lower teeth. Per SLP pt tolerated liqiuds well but not applesauce. After discussing diets SLP plans to recommend clear liquid diet and avoid full liquids due to issues with additional foods allowed on that diet (grits, applesauce, etc). Pt drinks chocolate boost at home and is willing to drink chocolate ensure while hospitalized. Clear liquid diet is very insufficient in kcal and protein and pt would benefit from oral nutrition supplements.  Pt usual weight in 2018 was 145 lb Per records pt was 132 lb 11/2019  which indicates a 15% weight loss in 3 months.   Medications reviewed and include: folic acid, SSI, MVI with minerals, thiamine Labs reviewed: vit B12: 1047, folate: 65.4    NUTRITION - FOCUSED PHYSICAL EXAM:    Most Recent Value  Orbital Region Severe depletion  Upper Arm Region Severe depletion  Thoracic and Lumbar Region Severe depletion  Buccal Region Severe depletion  Temple Region Severe depletion  Clavicle Bone Region Severe depletion  Clavicle and Acromion Bone Region Severe depletion  Scapular Bone Region Severe depletion  Dorsal Hand Severe depletion  Patellar Region Severe depletion  Anterior Thigh Region Severe depletion  Posterior Calf Region Severe depletion  Edema (RD Assessment) None  Hair Reviewed  Eyes Reviewed  Mouth Reviewed  Skin Reviewed  Nails Reviewed       Diet Order:   Diet Order            Diet clear liquid Room service appropriate? Yes with Assist; Fluid consistency: Thin  Diet effective now                 EDUCATION NEEDS:   No education needs have been identified at this time  Skin:  Skin Assessment: Reviewed RN Assessment  Last BM:  9/6 small/type 1  Height:   Ht Readings from Last 1 Encounters:  02/26/20 '6\' 1"'  (1.854 m)    Weight:   Wt Readings from Last 1 Encounters:  02/26/20 51.1 kg    Ideal Body Weight:  83.6 kg  BMI:  Body mass index is 14.86 kg/m.  Estimated Nutritional Needs:   Kcal:  1550-1800  Protein:  80-100 grams  Fluid:  > 1.6 L/day  Lockie Pares., RD, LDN, CNSC See AMiON for contact information

## 2020-02-26 NOTE — Evaluation (Signed)
Clinical/Bedside Swallow Evaluation Patient Details  Name: Stephen Shepherd MRN: [id number] Date of Birth: 03-Jun-1925  Today's Date: 02/26/2020 Time: SLP Start Time (ACUTE ONLY): 1100 SLP Stop Time (ACUTE ONLY): 1120 SLP Time Calculation (min) (ACUTE ONLY): 20 min  Past Medical History:  Past Medical History:  Diagnosis Date  . Stroke [facility])    Past Surgical History: History reviewed. No pertinent surgical history. 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   Assessment / Plan / Recommendation Clinical Impression  Patient presents with a moderate primary pharyngeal dysphagia resulting in immediate throat clearing/dry coughing and expectoration of puree solids post swallows. Swallow initiation was timely, patient with audible swallows which appeared effortful. Prior to PO's, patient had thick secretions on soft palate which SLP removed during oral care. Patient stated that he has history of coughing out/hocking phlegm in the mornings. Per chart review, patient had CVA 30 years ago for which he has mild residual weakness on right side. SLP Visit Diagnosis: Dysphagia, unspecified (R13.10)    Aspiration Risk  Mild aspiration risk    Diet Recommendation Thin liquid   Liquid Administration via: Cup;Straw Supervision: Patient able to self feed Compensations: Slow rate;Small sips/bites Postural Changes: Seated upright at 90 degrees;Remain upright for at least 30 minutes after po intake    Other  Recommendations Oral Care Recommendations: Oral care BID   Follow up Recommendations Other (comment) (TBD)      Frequency and Duration min 2x/week  1  week       Prognosis Prognosis for Safe Diet Advancement: Good      Swallow Study   General Date of Onset: 02/25/20 HPI: 86 y/o M with a PMHx of HFrEF due to ischemic cardiomyopathy (last EF 37% TTE 07/2019), 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 Type of Study: Bedside Swallow Evaluation Previous Swallow Assessment: N/A Diet Prior to this Study: NPO Temperature Spikes Noted: No Respiratory Status: Room air History of Recent Intubation: No Behavior/Cognition: Alert;Pleasant mood;Cooperative Oral Cavity Assessment: Within Functional Limits Oral Care Completed by SLP: Yes Oral Cavity - Dentition: Dentures, top;Edentulous Vision: Functional for self-feeding Self-Feeding Abilities: Able to feed self Patient Positioning: Upright in chair Baseline Vocal Quality: Low vocal intensity Volitional Cough: Weak Volitional Swallow: Able to elicit    Oral/Motor/Sensory Function Overall Oral Motor/Sensory Function: Mild impairment Facial ROM: Within Functional Limits Facial Symmetry: Within Functional Limits Facial Strength: Within Functional Limits Facial Sensation: Within Functional Limits Lingual ROM: Within Functional Limits Lingual Symmetry: Within Functional Limits Lingual Strength: Reduced Velum: Within Functional Limits Mandible: Within Functional Limits   Ice Chips     Thin Liquid Thin Liquid: Impaired Presentation: Straw;Self Fed Pharyngeal  Phase Impairments: Throat Clearing - Immediate;Cough - Immediate;Cough - Delayed Other Comments: Patient with productive but weak dry cough/throat clear and expectorated thick light yellow/clear secretions when doing so    Nectar Thick     Honey Thick  Puree Puree:  Impaired Pharyngeal Phase Impairments: Throat Clearing - Delayed Other Comments: Patient had throat clearing/dry cough throughout PO intake and with applesauce, he expectorated about 60% along with secretions   Solid     Solid: Not tested      Sonia Baller, MA, [facility] Pager: [phone]

## 2020-02-26 NOTE — Progress Notes (Signed)
Subjective: States that he is hungry and and thirsty on NPO diet.  He reports that he has not eaten or drinking anything in 2 days.  Patient reports no other pain this a.m.  Objective:  Vital signs in last 24 hours: Vitals:   02/26/20 0105 02/26/20 0114 02/26/20 0742 02/26/20 1132  BP: 99/72  108/65   Pulse: 67  69   Resp: 18     Temp: 97.9 F (36.6 C)  98.4 F (36.9 C)   TempSrc: Oral     SpO2: 97%  98%   Weight:  51.1 kg    Height:    6\' 1"  (1.854 m)   Constitutional:      General: He is awake. He is not in acute distress.    Appearance: He is cachectic with temporal wasting. He is ill-appearing. He is not toxic-appearing or diaphoretic.  HENT:     Head: Normocephalic and atraumatic.     Mouth: Mucous membranes are dry.     Pharynx: No oropharyngeal exudate or posterior oropharyngeal erythema.  Eyes:     Extraocular Movements: Extraocular movements intact.     Pupils: Pupils are equal, round, and reactive to light.     Comments: Coloboma of left eye  Cardiovascular:     Rate and Rhythm: Normal rate. Rhythm irregular.     Heart sounds: No murmur heard.  No friction rub. No gallop.   Pulmonary:     Effort: Pulmonary effort is normal at rest; however after minor exertion increased respiratory rate noted with increased work of breathing.     Breath sounds: Inspiratory crackles in right lower lobe Abdominal:     General: Abdomen is scaphoid.     Tenderness: There is no abdominal tenderness.  Musculoskeletal:        General: No swelling or tenderness.     Right lower leg: No edema.     Left lower leg: No edema.  Skin:    Coloration: Skin is pale.     Findings: No rash.  Neurological:     General: No focal deficit present.     Mental Status: He is alert and oriented to person, place, and time.     Cranial Nerves: No cranial nerve deficit.     Sensory: No sensory deficit.  Psychiatric:        Mood and Affect: Mood normal.        Behavior: Behavior normal. Behavior  is cooperative.   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   #Suspected Aspiration Pneumonia Patient reported history of coughing associated only with p.o. intake.  CXR at presentation noted right lower lobar pneumonia.  Lower lobar pneumonia is likely 2/2 aspiration.  Patient heaved azithromycin bolus 500 mg at admission.  WBC at presentation 14.5 WBC this a.m. 8.6.  Lactic acid at presentation 2.1 lactic acid this a.m. 1.4.  Patient kept n.p.o. until SLP could see.  Patient denies ShOB, chest pain, or chills.  SLP recommends clear  liquid diet at this time.  UA resulted 5 ketones, moderate proteinuria, and rare bacteria.  Blood cultures from presentation have no growth at this time.  SLP saw patient and will schedule modified barium swallow for tomorrow a.m.   -A.m. CBCs -AM lactic acid - Blood cultures x 2  -Follow-up 9/6 blood culture -Continue D5 LR at 75 mL/hr -Follow-up Ucx -Continue ampicillin-sulbactam 3 g IV every 6 day 2/5 -Continue azithromycin 250 mg IV for 4 days, day 1/4 - Acetaminophen 650 mg tab or suppository ordered for any fever -Follow-up modified barium swallow 9/7  #Weakness/frailty Patient originally presented to ED after "fall" from standing height out of bed.  Patient did not have any LOC or hit his head during fall.  Per patient he slipped out of bed.  Patient reports weakness has worsened over the past few months and then his own words "25 pound  weight loss" this year.  Patient appears to have lost much more than 25 pounds.  Minimal exertion was difficult for patient. Nutrition seen patient where he reported chocolate boost at home.  Due to SLP recommendations nutrition at this time only recommend Ensure and live boost.  Nutrition feels that clear liquid diet is very insufficient intake on protein for the patient and he would benefit from oral nutrition supplements when able.  Suspect weakness is 2/2 malnutrition versus worsening adenocarcinoma.  OT has seen patient and they recommend SNF.  Total protein 5.8.  Albumin 2.4. -Consider GOC with palliative care consult    #Dysphagia Patient reported coughing with p.o. intake only.  Patient also noted to have likely aspiration pneumonia.  Patient seen by SLP who recommended adequate diet.  We will also continue multivitamins as able. -Continue to crush/convert meds to PO equivalence  - Will hold Multivitamin with minerals ordered, 1 tab daily, until able to further advance diet  - Thiamine B-1 100 mg IV daily as well as folic acid 1 mg IV daily - B12 and Folate ordered   #Adenocarcinoma of the prostate  Per patient records diagnosed with adenocarcinoma of the prostate.  Patient appeared unaware of this diagnosis.  However in speaking with him today he is willing to go to oncology outpatient here in [place].  BUN 30 creatinine 0.86.  #Ischemic Cardiomyopathy History of HFrEF, last EF 37% TEE 07/2019, Hx of NYHA Class III. Denies signs/symptoms of CHF exacerbation. Will continue to monitor fluid status, daily weights and strict I/O's. -Continue Entresto 24-26 1 tab BID, will give crushed -Continue Metoprolol Succinate XL 25 mg daily switched to oral suspension 12.5 BID due to dysphagia -Continue Sprionolactone 12.5 mg daily, will administer crushed  #Abnormal EKG Patient found to have PAC's with LAD on EKG in the ED. Patient noted to have prior PAC's from previous cardiologist's notes.   - There plan was to up-titrate BB as tolerated during future visits - Patient will need to follow up with cardiology on outpatient basis. - Patient to continue metoprolol while admitted  Hx of CAD CABG in 2004. - Continue metoprolol 25 mg total daily as per above - Will order aspirin 81 mg daily -Start simvastatin 40 mg daily  -Consider palliative care for GOC  Hx of HTN BP of 108/65 this a.m.  Patient has been normotensive thus far. - Continue HF meds as per above - Will continue to montior BP  Hyperglycemia-resolved - Glucose of 74.  Hgb A1c 5.4. Novolog sliding scale ordered for any further episodes of hyperglycemia  Hx of Anemia - Hgb  of 13.4, MCV of 102.7.  B12  1,047 and folate 65.4. - Will supplement as needed  Diet: Clear liquids DVT Prophylaxis: Lovenox 40 mg daily Code Status: Full Code Was home before hospitalization. Barriers to discharge: Dysphagia, frailty Dispo: Admit patient to Inpatient with expected length of stay greater than 2 midnights. Dispo: Anticipated discharge in approximately 1-2 day(s).   Freida Busman, MD 02/26/2020, 11:42 AM Pager: [phone] After 5pm on weekdays and 1pm on weekends: On Call pager [phone]

## 2020-02-26 NOTE — Progress Notes (Addendum)
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   Clinical Impression   PTA, pt lived with his 50 yo wife at [place] where he was modified independent with mobility and ADL tasks @ RW level. Pt is frail in appearance and states he has lost 25 lbs. Pt with significant generalized weakness and poor balance and currently requires Mod A +2 for mobility, Mod A for UB ADL and Max A for LB ADL. Pt would benefit from rehab at SNF. Recommend Palliative Care consult. Will follow acutely to facilitate afe DC to next venue of care.  BP sitting after transfer to chair 119/67; HR 77; SpO2 RA 95 DOE 3/4 after movement    Follow Up Recommendations  SNF;Supervision/Assistance - 24 hour    Equipment Recommendations  None recommended by OT    Recommendations for Other Services  (Palliative Care Consult)     Precautions / Restrictions Precautions Precautions: Fall      Mobility Bed Mobility Overal bed mobility: Needs Assistance Bed Mobility: Supine to Sit     Supine to sit: HOB elevated;Max assist;+2 for safety/equipment     General bed mobility comments: Able to initiate by moving legs, howevver significatnly limited by weakness  Transfers Overall transfer level: Needs assistance Equipment used: Rolling walker (2 wheeled) Transfers: Sit to/from [facility] Sit to Stand: Mod assist;+2 physical assistance Stand pivot transfers: Mod assist;+2 physical assistance        General transfer comment: posterior bias    Balance Overall balance assessment: Needs assistance   Sitting balance-Leahy Scale: Fair       Standing balance-Leahy Scale: Poor                             ADL either performed or assessed with clinical judgement   ADL Overall ADL's : Needs assistance/impaired Eating/Feeding: NPO   Grooming: Minimal assistance;Sitting   Upper Body Bathing: Minimal assistance;Sitting   Lower Body Bathing: Maximal assistance;Sit to/from stand   Upper Body Dressing : Moderate assistance;Sitting   Lower Body Dressing: Maximal assistance;Sit to/from stand   Toilet Transfer: Moderate assistance;+2 for physical assistance;Stand-pivot Toilet Transfer Details (indicate cue type and reason): simulated Toileting- Clothing Manipulation and Hygiene: Maximal assistance Toileting - Clothing Manipulation Details (indicate cue type and reason): condom cath; unable to release RW in standing             Vision Baseline Vision/History: No visual deficits       Perception     Praxis      Pertinent Vitals/Pain Pain Assessment: Faces Faces Pain Scale: Hurts a little bit Pain Location: genealized Pain Descriptors / Indicators: Grimacing Pain Intervention(s): Limited activity within patient's tolerance     Hand Dominance Left   Extremity/Trunk Assessment Upper Extremity Assessment Upper Extremity Assessment: RUE deficits/detail;LUE deficits/detail RUE Deficits / Details: genealized weakness; limited shoulder flexion to @ 60; states he has weakness from previous CVA; able to use functionally RUE Coordination: decreased fine motor;decreased gross motor  LUE Deficits / Details: generalized weakness   Lower Extremity Assessment Lower Extremity Assessment: Defer to PT evaluation   Cervical / Trunk Assessment Cervical / Trunk Assessment: Kyphotic;Other exceptions (posterior bias)   Communication     Cognition Arousal/Alertness:  Awake/alert Behavior During Therapy: WFL for tasks assessed/performed Overall Cognitive Status: No family/caregiver present to determine baseline cognitive functioning                                 General Comments: most likely close to baseline   General Comments       Exercises     Shoulder Instructions      Home Living Family/patient expects to be discharged to:: Assisted living                             Home Equipment: Walker - 2 wheels;Bedside commode;Shower seat - built in;Grab bars - tub/shower;Grab bars - toilet          Prior Functioning/Environment Level of Independence: Independent with assistive device(s);Needs assistance  Gait / Transfers Assistance Needed: Uses RW to mobilize ADL's / Homemaking Assistance Needed: Does his own bathing/dressing; has meals delivered; he and his wife work together to help each other out Communication / Swallowing Assistance Needed: difficulty understanding pt at times due to poorly fitting dentures          OT Problem List: Decreased strength;Decreased range of motion;Decreased activity tolerance;Impaired balance (sitting and/or standing);Decreased coordination;Decreased safety awareness;Decreased knowledge of use of DME or AE;Cardiopulmonary status limiting activity;Impaired UE functional use;Pain      OT Treatment/Interventions: Self-care/ADL training;Therapeutic exercise;Neuromuscular education;Energy conservation;DME and/or AE instruction;Therapeutic activities;Patient/family education;Balance training    OT Goals(Current goals can be found in the care plan section) Acute Rehab OT Goals Patient Stated Goal: to get stronger OT Goal Formulation: With patient Time For Goal Achievement: 03/11/20 Potential to Achieve Goals: Fair  OT Frequency: Min 2X/week   Barriers to D/C:            Co-evaluation              AM-PAC OT "6 Clicks" Daily Activity     Outcome Measure Help from another  person eating meals?: A Little Help from another person taking care of personal grooming?: A Little Help from another person toileting, which includes using toliet, bedpan, or urinal?: A Lot Help from another person bathing (including washing, rinsing, drying)?: A Lot Help from another person to put on and taking off regular upper body clothing?: A Lot Help from another person to put on and taking off regular lower body clothing?: A Lot 6 Click Score: 14   End of Session Equipment Utilized During Treatment: Gait belt;Rolling walker Nurse Communication: Mobility status  Activity Tolerance: Patient tolerated treatment well Patient left: in chair;with call bell/phone within reach;with chair alarm set  OT Visit Diagnosis: Unsteadiness on feet (R26.81);Other abnormalities of gait and mobility (R26.89);Muscle weakness (generalized) (M62.81);History of falling (Z91.81);Pain;Feeding difficulties (R63.3) Pain - Right/Left: Right Pain - part of body: Shoulder                Time: 3220-2542 OT Time Calculation (min): 25 min Charges:  OT General Charges $OT Visit: 1 Visit OT Evaluation $OT Eval Moderate Complexity: [place], OT/L   Acute OT Clinical Specialist Acute Rehabilitation Services Pager [phone] Office [phone]   [facility] 02/26/2020, 10:43 AM

## 2020-02-27 ENCOUNTER — Telehealth: Payer: Self-pay

## 2020-02-27 LAB — CBC WITH DIFFERENTIAL/PLATELET
Abs Immature Granulocytes: 0.02 10*3/uL (ref 0.00–0.07)
Basophils Absolute: 0 10*3/uL (ref 0.0–0.1)
Basophils Relative: 0 %
Eosinophils Absolute: 0.1 10*3/uL (ref 0.0–0.5)
Eosinophils Relative: 1 %
HCT: 32.6 % — ABNORMAL LOW (ref 39.0–52.0)
Hemoglobin: 10.8 g/dL — ABNORMAL LOW (ref 13.0–17.0)
Immature Granulocytes: 0 %
Lymphocytes Relative: 15 %
Lymphs Abs: 1 10*3/uL (ref 0.7–4.0)
MCH: 33.9 pg (ref 26.0–34.0)
MCHC: 33.1 g/dL (ref 30.0–36.0)
MCV: 102.2 fL — ABNORMAL HIGH (ref 80.0–100.0)
Monocytes Absolute: 0.7 10*3/uL (ref 0.1–1.0)
Monocytes Relative: 10 %
Neutro Abs: 5 10*3/uL (ref 1.7–7.7)
Neutrophils Relative %: 74 %
Platelets: 180 10*3/uL (ref 150–400)
RBC: 3.19 MIL/uL — ABNORMAL LOW (ref 4.22–5.81)
RDW: 12.6 % (ref 11.5–15.5)
WBC: 6.8 10*3/uL (ref 4.0–10.5)
nRBC: 0 % (ref 0.0–0.2)

## 2020-02-27 LAB — GLUCOSE, CAPILLARY
Glucose-Capillary: 81 mg/dL (ref 70–99)
Glucose-Capillary: 125 mg/dL — ABNORMAL HIGH (ref 70–99)
Glucose-Capillary: 90 mg/dL (ref 70–99)
Glucose-Capillary: 149 mg/dL — ABNORMAL HIGH (ref 70–99)

## 2020-02-27 MED ORDER — RESOURCE THICKENUP CLEAR PO POWD
ORAL | Status: DC | PRN
  Filled 2020-02-27 (×2): qty 125

## 2020-02-27 NOTE — Telephone Encounter (Signed)
NOTES ON FILE FROM  [facility] [phone] SENT REFERRAL TO SCHEDULING

## 2020-02-27 NOTE — Progress Notes (Signed)
Subjective:  Patient reports that he is doing well today. He is aware of the results of his barium swallow and is curious if there is a cure for his significant dysphagia. Patient had no overnight events. Objective:  Vital signs in last 24 hours: Vitals:   02/26/20 1635 02/26/20 2009 02/26/20 2153 02/27/20 0119  BP: 110/63 127/69    Pulse: 76 76    Resp:  20    Temp: 97.8 F (36.6 C) 97.9 F (36.6 C)    TempSrc:  Oral    SpO2: 98% 94%    Weight:   56.5 kg 56.5 kg  Height:       Physical Exam Constitutional:      Comments: Severely cachetic with temporal wasting  HENT:     Head: Normocephalic and atraumatic.     Mouth/Throat:     Mouth: Mucous membranes are dry.  Eyes:     Extraocular Movements: Extraocular movements intact.     Conjunctiva/sclera: Conjunctivae normal.  Cardiovascular:     Rate and Rhythm: Normal rate. Rhythm irregular.  Pulmonary:     Comments: Decrease breath sounds in RLL, otherwise clear.  Less SOB with minimal exertion Abdominal:     Comments: Scaphoid, hyperactive bowel sounds  Lymphadenopathy:     Cervical: Cervical adenopathy present.     Upper Body:     Right upper body: No supraclavicular or axillary adenopathy.     Left upper body: No supraclavicular or axillary adenopathy.     Lower Body: Right inguinal adenopathy present. Left inguinal adenopathy present.     Comments: Positive for R sided cervical lymphadenopathy in chain.   Non beady feeling bil inguinal lymph nodes  Skin:    Coloration: Skin is pale.  Neurological:     Mental Status: He is alert.  Psychiatric:        Mood and Affect: Mood normal.        Behavior: Behavior normal.        Thought Content: Thought content normal.        Judgment: Judgment normal.     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   #Suspected Aspiration Pneumonia WBC this AM 6.8.Marland Kitchen  Blood cultures from presentation have no growth at this time.  Blood cultures post treatment have ngtd. SLP completed modified barium swallow and noted that patient needed to be on thin liquid diet. Patient has significant dysfunction while swallowing which is reported in their note. UA grew insignificant number of unknown colonies. -A.m. CBCs -AM lactic acid - Admission Blood cultures x 2, ngtd - 9/6 blood culture, ngtd -discontinued D5 LR at 75 mL/hr -Continue ampicillin-sulbactam 3 g IV every 6 day 3/5 -Continue azithromycin 250 mg IV for 4 days, day 2/4 - Acetaminophen 650 mg tab or suppository ordered for any fever   #Weakness/frailty Patient realizes that he needs more assistance and is aware that therapy recommends he be discharged to SNF. He questions why he cannot go home and do therapy outpatient. After explaining  to patient he agrees that he does need more around the clock care. Patient also acknowledges he has had significant weight loss and attributes this to eating less. He recognizes that he has had decreased PO intake due to increased difficulty eating. Patient has been made aware of his modified barium swallow studies and knows that the recommendations are for clear liquids. SLP was in room during our interview and spoke with the patient about future meal plans. Patient decided that he would rather eat what he could knowing that he will likely aspirate and develop pneumonia. Patient feels that once he acquires mandibular dentures he will be able to eat a little better. Patient has already received the  implants to get them. Hgb this AM 10.8. MCV 102, stable. -Consult family about goals of care - TOC has been consulted and spoke with patient family and sent out request to certain SNFs    #Dysphagia At this time we will continue clear liquid diet. Will schedule GOC with family and patient. -Continue to crush/convert meds to PO equivalence  - Will hold Multivitamin with minerals ordered, 1 tab daily, until able to further advance diet  - Thiamine B-1 100 mg IV daily as well as folic acid 1 mg IV daily - B12 and Folate ordered   #Adenocarcinoma of the prostate Per patient records diagnosed with adenocarcinoma of the prostate.  He is willing to go to oncology outpatient here in [place].  BUN 30 creatinine 0.86.  #Ischemic Cardiomyopathy History of HFrEF, last EF 37% TEE 07/2019, Hx of NYHA Class III. Denies signs/symptoms of CHF exacerbation. Will continue to monitor fluid status, daily weights. -Continue Entresto 24-26 1 tab BID, will give crushed -Continue Metoprolol Succinate XL 25 mg daily switched to oral suspension12.5 BID due to dysphagia -Continue Sprionolactone 12.5 mg daily, willadministercrushed  #Abnormal EKG Patient found to have PAC's with LAD on EKG in the ED. Patient noted to have prior PAC's from previous cardiologist's notes.  - There plan was to up-titrate BB as tolerated during future visits - Patient will need to follow up with cardiology on outpatient basis. - Patient to continue metoprolol while admitted  Hx of CAD CABG in 2004. - Continue metoprolol 25 mg total dailyas per above -aspirin 81 mg daily - simvastatin 40 mg daily -Consider palliative care for GOC  Hx of HTN BP of 107/60 this a.m.  Patient has been normotensive thus far. - Continue HF meds as per above - Will continue to montior BP  Hyperglycemia-resolved - Glucose of 125.  Hgb A1c 5.4. Novolog sliding scale ordered for any further episodes of hyperglycemia  Hx of  Anemia - Hgb of 13.4, MCV of 102.7.  B12  1,047 and folate 65.4. - Will supplement as needed  Diet: Clear liquids DVT Prophylaxis: Lovenox 40 mg daily Code Status: Full Code Was home before hospitalization. Barriers to discharge: Dysphagia, frailty Dispo: Admit patient toInpatient with expected length of stay greater than 2 midnights.  Dispo: Anticipated discharge when SNF bed available.   Freida Busman, MD 02/27/2020, 5:52 AM Pager: [phone] After 5pm on weekdays and 1pm on weekends: On Call pager [phone]

## 2020-02-27 NOTE — Progress Notes (Signed)
  Speech Language Pathology Treatment: Dysphagia  Patient Details Name: Stephen Shepherd MRN: [id number] DOB: October 08, 1924 Today's Date: 02/27/2020 Time: 7517-0017 SLP Time Calculation (min) (ACUTE ONLY): 37 min  Assessment / Plan / Recommendation Clinical Impression  Session comprised of extensive pt education re: yesterday's MBS. During this time pt's attending MD/resident's arrived and pt's suspected chronic dysphagia was discussed. He is unable to adequately protect airway consistently during swallow presently given decreased overall medical reserve and endurance. Pt asking appropriate questions re: treatment options ("is there anything to be done about it?) and educated re: goal is to mitigate risk as much as possible but risk will remain. His desire is to continue po's and would like a "philly steak and cheese" to be precise :-). Thicker liquids did result in increased residue but may decrease level of discomfort as RN reported explosive coughs with thin broth. Will initiate nectar thick and if pt prefers to return to thin, can easily upgrade. Discussed texture options and Dys 2 ( fine chopped) agreed upon (he has no lower dentition) and nectar thick liquids (can modify as pt desires). Crush meds, multiple swallows, and throat clears. Cup sips preferred and use reverse trendelenburg position to bring chin/head to neutral position.    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 On MBS there was evidence of posterior cervical fusion ("many years ago per pt").      SLP Plan  Continue with current plan of care       Recommendations  Diet  recommendations: Dysphagia 2 (fine chop);Nectar-thick liquid Liquids provided via: Cup;No straw Medication Administration: Crushed with puree Supervision: Patient able to self feed;Full supervision/cueing for compensatory strategies Compensations: Slow rate;Small sips/bites;Multiple dry swallows after each bite/sip;Clear throat intermittently Postural Changes and/or Swallow Maneuvers: Seated upright 90 degrees;Upright 30-60 min after meal                Oral Care Recommendations: Oral care BID Follow up Recommendations: None (none vs home health) SLP Visit Diagnosis: Dysphagia, oropharyngeal phase (R13.12) Plan: Continue with current plan of care                       Houston Siren 02/27/2020, 11:53 AM Orbie Pyo Colvin Caroli.Ed Risk analyst [phone] Office [phone]

## 2020-02-27 NOTE — TOC Initial Note (Addendum)
Transition of Care [facility]) - Initial/Assessment Note    Patient Details  Name: Stephen Shepherd MRN: [id number] Date of Birth: [birth year]  [facility]) CM/SW Contact:    Joanne Chars, LCSW Phone Number: 02/27/2020, 10:08 AM  Clinical Narrative:    CSW spoke with pt regarding SNF recommendation.  Pt agreeable to this, reports he just came to [place] from [place] and has only been at [facility] a short while, wants to return when able.  Permission given to speak to wife and daughter and to send out referral for SNF.  Choice document provided.  Pt is vaccinated.      CSW spoke with pt daughter, Opal Sidles, who is Therapist, sports CM for [employer].  She is in agreement with SNF, would like [facility] if possible.    1250: Pt daughter Opal Sidles requests referrals sent to [place], [place] locations, [facility].        Expected Discharge Plan: Skilled Nursing Facility Barriers to Discharge: Continued Medical Work up, SNF Pending bed offer   Patient Goals and CMS Choice Patient states their goals for this hospitalization and ongoing recovery are:: get my strength back, take care of myself. CMS Medicare.gov Compare Post Acute Care list provided to:: Patient Choice offered to / list presented to : Patient  Expected Discharge Plan and Services Expected Discharge Plan: [place] Choice: [place] arrangements for the past 2 months: [place] (moved to assisted living 3 weeks ago.  Home before that.)                                      Prior Living Arrangements/Services Living arrangements for the past 2 months: [place] (moved to assisted living 3 weeks ago.  Home before that.) Lives with:: Facility Resident Patient language and need for interpreter reviewed:: Yes Do you feel safe going back to the place where you live?: Yes      Need for Family Participation in Patient Care:  Yes (Comment) Care giver support system in place?: Yes (comment)   Criminal Activity/Legal Involvement Pertinent to Current Situation/Hospitalization: No - Comment as needed  Activities of Daily Living   ADL Screening (condition at time of admission) Patient's cognitive ability adequate to safely complete daily activities?: Yes Is the patient deaf or have difficulty hearing?: No Does the patient have difficulty seeing, even when wearing glasses/contacts?: No Does the patient have difficulty concentrating, remembering, or making decisions?: No Patient able to express need for assistance with ADLs?: Yes Does the patient have difficulty dressing or bathing?: Yes Independently performs ADLs?: No Communication: Independent Dressing (OT): Dependent Is this a change from baseline?: Pre-admission baseline Grooming: Dependent Is this a change from baseline?: Pre-admission baseline Feeding: Dependent Is this a change from baseline?: Change from baseline, expected to last >3 days Bathing: Dependent Is this a change from baseline?: Pre-admission baseline Toileting: Dependent Is this a change from baseline?: Pre-admission baseline In/Out Bed: Dependent Is this a change from baseline?: Pre-admission baseline Walks in Home: Dependent Is this a change from baseline?: Pre-admission baseline Does the patient have difficulty walking or climbing stairs?: Yes Weakness of Legs: Both Weakness of Arms/Hands: Both  Permission Sought/Granted Permission sought to share information with : Facility Sport and exercise psychologist, Family Supports Permission granted to share information with : Yes, Verbal Permission Granted  Share Information with NAME: Opal Sidles, daughter and  Webb Silversmith, wife.  Permission granted to share info w AGENCY: SNF        Emotional Assessment Appearance:: Appears stated age Attitude/Demeanor/Rapport: Engaged Affect (typically observed): Pleasant Orientation: : Oriented to Self, Oriented to  Place, Oriented to Situation Alcohol / Substance Use: Not Applicable Psych Involvement: No (comment)  Admission diagnosis:  Community acquired pneumonia [J18.9] Aspiration pneumonia of right lower lobe, unspecified aspiration pneumonia type ([place]) [J69.0] Sepsis, due to unspecified organism, unspecified whether acute organ dysfunction present [facility]) [A41.9] Patient Active Problem List   Diagnosis Date Noted  . Protein-calorie malnutrition, severe 02/26/2020  . Aspiration pneumonia ([place]) 02/25/2020   PCP:  Patient, No Pcp Per Pharmacy:   [id number] - [place], [place] [address] [place] [address] Phone: [phone] Fax: [phone]     Social Determinants of Health (SDOH) Interventions    Readmission Risk Interventions No flowsheet data found.

## 2020-02-27 NOTE — NC FL2 (Signed)
[place] LEVEL OF CARE SCREENING TOOL     IDENTIFICATION  Patient Name: Stephen Shepherd Birthdate: 12-17-1924 Sex: male Admission Date (Current Location): 02/25/2020  [facility] [place] Number:  Herbalist and Address:  [facility], [place] [address], [place], [place] [address]      Provider Number: [id number]  Attending Physician Name and Address:  Angelica Pou, MD  Relative Name and Phone Number:  Blinda Leatherwood [phone]    Current Level of Care: Hospital Recommended Level of Care: [place] Prior Approval Number:    Date Approved/Denied:   PASRR Number: [id number]  Discharge Plan: SNF    Current Diagnoses: Patient Active Problem List   Diagnosis Date Noted  . Protein-calorie malnutrition, severe 02/26/2020  . Aspiration pneumonia ([place]) 02/25/2020    Orientation RESPIRATION BLADDER Height & Weight     Self, Situation, Place  Normal Incontinent Weight: 124 lb 9 oz (56.5 kg) Height:  6\' 1"  (185.4 cm)  BEHAVIORAL SYMPTOMS/MOOD NEUROLOGICAL BOWEL NUTRITION STATUS      Incontinent Diet (clear liquid, see discharge summary)  AMBULATORY STATUS COMMUNICATION OF NEEDS Skin   Extensive Assist Verbally Other (Comment) (ecchymosis)                       Personal Care Assistance Level of Assistance  Bathing, Feeding, Dressing Bathing Assistance: Maximum assistance Feeding assistance: Limited assistance Dressing Assistance: Maximum assistance     Functional Limitations Info  Sight, Hearing, Speech Sight Info: Adequate Hearing Info: Adequate Speech Info: Adequate    SPECIAL CARE FACTORS FREQUENCY  PT (By licensed PT), OT (By licensed OT), Speech therapy     PT Frequency: 5x week OT Frequency: 5x week     Speech Therapy Frequency: TBD      Contractures Contractures Info: Not present    Additional Factors Info  Code Status, Allergies, Insulin Sliding Scale Code Status Info:  full Allergies Info: NKA   Insulin Sliding Scale Info: 0-9 units, 3x day with meals       Current Medications (02/27/2020):  This is the current hospital active medication list Current Facility-Administered Medications  Medication Dose Route Frequency Provider Last Rate Last Admin  . acetaminophen (TYLENOL) tablet 650 mg  650 mg Oral Q6H PRN Harvie Heck, MD       Or  . acetaminophen (TYLENOL) suppository 650 mg  650 mg Rectal Q6H PRN Aslam, Sadia, MD      . Ampicillin-Sulbactam (UNASYN) 3 g in sodium chloride 0.9 % 100 mL IVPB  3 g Intravenous Q6H Aslam, Sadia, MD 200 mL/hr at 02/27/20 0629 3 g at 02/27/20 0629  . aspirin chewable tablet 81 mg  81 mg Oral Daily Damita Dunnings B, MD   81 mg at 02/27/20 0852  . azithromycin (ZITHROMAX) 250 mg in dextrose 5 % 125 mL IVPB  250 mg Intravenous Q24H Harvie Heck, MD 125 mL/hr at 02/26/20 2123 250 mg at 02/26/20 2123  . enoxaparin (LOVENOX) injection 40 mg  40 mg Subcutaneous Q24H Aslam, Loralyn Freshwater, MD   40 mg at 02/26/20 2123  . feeding supplement (ENSURE ENLIVE) (ENSURE ENLIVE) liquid 237 mL  237 mL Oral QID Damita Dunnings B, MD   237 mL at 02/27/20 0908  . folic acid injection 1 mg  1 mg Intravenous Daily Harvie Heck, MD   1 mg at 02/26/20 0814  . insulin aspart (novoLOG) injection 0-9 Units  0-9 Units Subcutaneous TID WC Harvie Heck, MD  2 Units at 02/26/20 1712  . metoprolol tartrate (LOPRESSOR) 25 mg/10 mL oral suspension 12.5 mg  12.5 mg Oral BID Harvie Heck, MD   12.5 mg at 02/27/20 0856  . multivitamin with minerals tablet 1 tablet  1 tablet Oral Daily Harvie Heck, MD   1 tablet at 02/27/20 0852  . sacubitril-valsartan (ENTRESTO) 24-26 mg per tablet  1 tablet Oral BID Riesa Pope, MD   1 tablet at 02/27/20 0852  . simvastatin (ZOCOR) tablet 40 mg  40 mg Oral q1800 Damita Dunnings B, MD   40 mg at 02/26/20 1708  . spironolactone (ALDACTONE) tablet 12.5 mg  12.5 mg Oral Daily Katsadouros, Vasilios, MD   12.5 mg at 02/27/20 0853  .  thiamine (B-1) injection 100 mg  100 mg Intravenous Daily Aslam, Loralyn Freshwater, MD   100 mg at 02/27/20 0857     Discharge Medications: Please see discharge summary for a list of discharge medications.  Relevant Imaging Results:  Relevant Lab Results:   Additional Information SSN: [id number]  Joanne Chars, LCSW

## 2020-02-28 LAB — CBC WITH DIFFERENTIAL/PLATELET
Abs Immature Granulocytes: 0.04 10*3/uL (ref 0.00–0.07)
Basophils Absolute: 0 10*3/uL (ref 0.0–0.1)
Basophils Relative: 0 %
Eosinophils Absolute: 0.1 10*3/uL (ref 0.0–0.5)
Eosinophils Relative: 1 %
HCT: 35.7 % — ABNORMAL LOW (ref 39.0–52.0)
Hemoglobin: 11.5 g/dL — ABNORMAL LOW (ref 13.0–17.0)
Immature Granulocytes: 0 %
Lymphocytes Relative: 9 %
Lymphs Abs: 0.8 10*3/uL (ref 0.7–4.0)
MCH: 32.6 pg (ref 26.0–34.0)
MCHC: 32.2 g/dL (ref 30.0–36.0)
MCV: 101.1 fL — ABNORMAL HIGH (ref 80.0–100.0)
Monocytes Absolute: 0.7 10*3/uL (ref 0.1–1.0)
Monocytes Relative: 8 %
Neutro Abs: 7.6 10*3/uL (ref 1.7–7.7)
Neutrophils Relative %: 82 %
Platelets: 177 10*3/uL (ref 150–400)
RBC: 3.53 MIL/uL — ABNORMAL LOW (ref 4.22–5.81)
RDW: 12.5 % (ref 11.5–15.5)
WBC: 9.4 10*3/uL (ref 4.0–10.5)
nRBC: 0 % (ref 0.0–0.2)

## 2020-02-28 LAB — GLUCOSE, CAPILLARY
Glucose-Capillary: 84 mg/dL (ref 70–99)
Glucose-Capillary: 125 mg/dL — ABNORMAL HIGH (ref 70–99)

## 2020-02-28 LAB — COMPREHENSIVE METABOLIC PANEL
ALT: 21 U/L (ref 0–44)
AST: 25 U/L (ref 15–41)
Albumin: 2.1 g/dL — ABNORMAL LOW (ref 3.5–5.0)
Alkaline Phosphatase: 51 U/L (ref 38–126)
Anion gap: 5 (ref 5–15)
BUN: 15 mg/dL (ref 8–23)
CO2: 28 mmol/L (ref 22–32)
Calcium: 8.1 mg/dL — ABNORMAL LOW (ref 8.9–10.3)
Chloride: 106 mmol/L (ref 98–111)
Creatinine, Ser: 0.63 mg/dL (ref 0.61–1.24)
GFR calc Af Amer: >60 mL/min (ref >60)
GFR calc non Af Amer: >60 mL/min (ref >60)
Glucose, Bld: 106 mg/dL — ABNORMAL HIGH (ref 70–99)
Potassium: 3.6 mmol/L (ref 3.5–5.1)
Sodium: 139 mmol/L (ref 135–145)
Total Bilirubin: 0.4 mg/dL (ref 0.3–1.2)
Total Protein: 5.1 g/dL — ABNORMAL LOW (ref 6.5–8.1)

## 2020-02-28 LAB — SARS CORONAVIRUS 2 BY RT PCR (HOSPITAL ORDER, PERFORMED IN ~~LOC~~ HOSPITAL LAB): SARS Coronavirus 2: NEGATIVE

## 2020-02-28 MED ORDER — AMOXICILLIN-POT CLAVULANATE 250-62.5 MG/5ML PO SUSR: 500.0000 mg | Freq: Two times a day (BID) | ORAL | 0 refills | Status: AC

## 2020-02-28 MED ORDER — METOPROLOL SUCCINATE ER 25 MG PO TB24: 25.0000 mg | ORAL_TABLET | Freq: Every day | ORAL | 0 refills | Status: AC

## 2020-02-28 MED ORDER — SIMVASTATIN 40 MG PO TABS: 40.0000 mg | ORAL_TABLET | Freq: Every day | ORAL | 0 refills | Status: AC

## 2020-02-28 MED ORDER — CHOLECALCIFEROL 25 MCG (1000 UT) PO TABS: 1000.0000 [IU] | ORAL_TABLET | Freq: Every day | ORAL | 0 refills | Status: AC

## 2020-02-28 MED ORDER — ENTRESTO 24-26 MG PO TABS: 1.0000 | ORAL_TABLET | Freq: Two times a day (BID) | ORAL | 0 refills | Status: AC

## 2020-02-28 MED ORDER — MULTIVITAMIN ADULTS 50+ PO TABS: 1.0000 | ORAL_TABLET | Freq: Every day | ORAL | 0 refills | Status: AC

## 2020-02-28 MED ORDER — SPIRONOLACTONE 25 MG PO TABS: 12.5000 mg | ORAL_TABLET | Freq: Every day | ORAL | 0 refills | Status: AC

## 2020-02-28 NOTE — Progress Notes (Signed)
Pt. Discharged to [facility] via [place]. Report given to Nurse at [facility] place.

## 2020-02-28 NOTE — Progress Notes (Signed)
Subjective: Patient was not sure of his location this AM but was reoriented easily. Patient reports feeling trapped in his bed this AM, but felt better after being elevated. Patient otherwise had no reports of pain.   Objective:  Vital signs in last 24 hours: Vitals:   02/27/20 1652 02/27/20 2009 02/27/20 2225 02/28/20 0102  BP: 137/78 113/66    Pulse: 70 85    Resp: 17 20    Temp: 97.6 F (36.4 C) 99.1 F (37.3 C)    TempSrc:  Oral    SpO2: 97% 97%    Weight:   54.7 kg 54.7 kg  Height:       Physical Exam Constitutional:      Comments: Severely cachetic with temporal wasting  HENT:     Head: Normocephalic and atraumatic.  Cardiovascular:     Rate and Rhythm: Normal rate. Rhythm irregular.  Pulmonary:     Comments: Good chest expansion, good breath sounds at apical lobes bilaterally Abdominal:     General: Bowel sounds are normal.     Comments: scaphoid  Neurological:     Mental Status: He is alert.     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   #Suspected Aspiration Pneumonia WBC this AM 9.4.Marland KitchenBlood cultures from presentation have no growth at this time.Blood cultures post treatment have ngtd. SLP completed modified barium swallow and noted that patient needed to be on thin liquid diet with dysphagia 2 diet. Patient has significant dysfunction while swallowing  which is reported in their note.  -A.m. CBCs -AM lactic acid - Admission Blood cultures x 2, ngtd - 9/6 blood culture, ngtd -Continue ampicillin-sulbactam 3 g IV every  day 4/5 -Continue azithromycin 250 mg IV for 4 days, day 3/4 - Acetaminophen 650 mg tab or suppository ordered for any fever   #Weakness/frailty Patient realizes that he needs more assistance and is aware that therapy recommends he be discharged to [facility] recognizes that he has had decreased PO intake due to increased difficulty eating. Patient has been made aware of his modified barium swallow studies and knows that the recommendations are for clear liquids. Patient feels that once he acquires mandibular dentures he will be able to eat a little better. Patient appears to be having a hard time understanding that his current state is likely irreversible and he will likely not be able to return to independent living.Hgb this AM 11.5. MCV 101, stable. - SNF bed ready, TOC has been speaking with patient daughter    #Dysphagia At this time we will continue clear liquid diet. Will schedule GOC with family and patient. -Continue tocrush/convert medsto PO equivalence  -Will holdMultivitamin with minerals ordered, 1 tab daily,until able to further advance diet - Thiamine B-1 100 mg IV daily as well as folic acid 1 mg IV daily - B12 and Folate ordered   #Adenocarcinoma of the prostate Per patient records diagnosed  with adenocarcinoma of the prostate. He is willing to go to oncology outpatient here in [place]. BUN 30>15 creatinine 0.63.  #Ischemic Cardiomyopathy History of HFrEF, last EF 37% TEE 07/2019, Hx of NYHA Class III.Denies signs/symptoms of CHF exacerbation.Will continue to monitor fluid status, daily weights. -ContinueEntresto 24-26 1 tab BID, will give crushed -ContinueMetoprolol Succinate XL 25 mg daily switched to oral suspension12.5 BID due todysphagia -ContinueSprionolactone 12.5 mg daily,  willadministercrushed  #Abnormal EKG Patient found to have PAC's with LAD on EKG in the ED.Patient noted to have prior PAC's from previous cardiologist's notes.  - There plan was to up-titrate BB as tolerated during future visits - Patient will need to follow up with cardiology on outpatient basis. - Patient to continue metoprolol while admitted  Hx of CAD CABG in 2004. - Continue metoprolol 25 mg total dailyas per above -aspirin 81 mg daily -simvastatin 40 mg daily -Consider palliative care for GOC  Hx of HTN BP of113/66 this a.m. Patient has been normotensive thus far. - Continue HF meds as per above - Will continue to montior BP  Hyperglycemia-resolved - Glucose of106.Hgb A1c 5.4. Novolog sliding scale ordered for any further episodes of hyperglycemia  Hx of Anemia - Hgb of 11.5, MCV of 101.1.B12 1,047and folate 65.4. - Will supplement as needed  Diet:Clear liquids DVT Prophylaxis: Lovenox 40 mg daily Code Status: Full Code Was home before hospitalization. Barriers to discharge: Dysphagia, frailty Dispo: Admit patient toInpatient with expected length of stay greater than 2 midnights.  Dispo: Anticipated discharge when SNF bed available.    Freida Busman, MD 02/28/2020, 7:34 AM Pager: [phone] After 5pm on weekdays and 1pm on weekends: On Call pager [phone]

## 2020-02-28 NOTE — Plan of Care (Signed)
  Problem: Education: Goal: Knowledge of General Education information will improve Description: Including pain rating scale, medication(s)/side effects and non-pharmacologic comfort measures 02/28/2020 0159 by Valora Corporal, RN Outcome: Progressing 02/28/2020 0158 by Valora Corporal, RN Outcome: Progressing   Problem: Health Behavior/Discharge Planning: Goal: Ability to manage health-related needs will improve 02/28/2020 0159 by Valora Corporal, RN Outcome: Progressing 02/28/2020 0158 by Valora Corporal, RN Outcome: Progressing   Problem: Clinical Measurements: Goal: Ability to maintain clinical measurements within normal limits will improve 02/28/2020 0159 by Valora Corporal, RN Outcome: Progressing 02/28/2020 0158 by Valora Corporal, RN Outcome: Progressing Goal: Will remain free from infection 02/28/2020 0159 by Valora Corporal, RN Outcome: Progressing 02/28/2020 0158 by Valora Corporal, RN Outcome: Progressing Goal: Diagnostic test results will improve 02/28/2020 0159 by Valora Corporal, RN Outcome: Progressing 02/28/2020 0158 by Valora Corporal, RN Outcome: Progressing Goal: Respiratory complications will improve 02/28/2020 0159 by Valora Corporal, RN Outcome: Progressing 02/28/2020 0158 by Valora Corporal, RN Outcome: Progressing Goal: Cardiovascular complication will be avoided 02/28/2020 0159 by Valora Corporal, RN Outcome: Progressing 02/28/2020 0158 by Valora Corporal, RN Outcome: Progressing   Problem: Activity: Goal: Risk for activity intolerance will decrease 02/28/2020 0159 by Valora Corporal, RN Outcome: Progressing 02/28/2020 0158 by Valora Corporal, RN Outcome: Not Progressing   Problem: Nutrition: Goal: Adequate nutrition will be maintained 02/28/2020 0159 by Valora Corporal, RN Outcome: Progressing 02/28/2020 0158 by Valora Corporal, RN Outcome: Progressing   Problem: Coping: Goal:  Level of anxiety will decrease 02/28/2020 0159 by Valora Corporal, RN Outcome: Progressing 02/28/2020 0158 by Valora Corporal, RN Outcome: Progressing   Problem: Elimination: Goal: Will not experience complications related to bowel motility 02/28/2020 0159 by Valora Corporal, RN Outcome: Progressing 02/28/2020 0158 by Valora Corporal, RN Outcome: Progressing Goal: Will not experience complications related to urinary retention 02/28/2020 0159 by Valora Corporal, RN Outcome: Progressing 02/28/2020 0158 by Valora Corporal, RN Outcome: Progressing   Problem: Pain Managment: Goal: General experience of comfort will improve 02/28/2020 0159 by Valora Corporal, RN Outcome: Progressing 02/28/2020 0158 by Valora Corporal, RN Outcome: Progressing   Problem: Safety: Goal: Ability to remain free from injury will improve 02/28/2020 0159 by Valora Corporal, RN Outcome: Progressing 02/28/2020 0158 by Valora Corporal, RN Outcome: Progressing   Problem: Skin Integrity: Goal: Risk for impaired skin integrity will decrease 02/28/2020 0159 by Valora Corporal, RN Outcome: Progressing 02/28/2020 0158 by Valora Corporal, RN Outcome: Progressing

## 2020-02-28 NOTE — TOC Transition Note (Signed)
Transition of Care [facility]) - CM/SW Discharge Note   Patient Details  Name: Damiano Stamper MRN: [id number] Date of Birth: [birth year]  Transition of Care [facility]) CM/SW Contact:  Joanne Chars, LCSW Phone Number: 02/28/2020, 2:31 PM   Clinical Narrative:   Pt going to [facility] room [facility].  RN please call report to [phone].      Final next level of care: Skilled Nursing Facility Barriers to Discharge: Barriers Resolved   Patient Goals and CMS Choice Patient states their goals for this hospitalization and ongoing recovery are:: get my strength back, take care of myself. CMS Medicare.gov Compare Post Acute Care list provided to:: Patient Choice offered to / list presented to : Patient  Discharge Placement PASRR number recieved: 02/27/20            Patient chooses bed at: [facility] Patient to be transferred to facility by: [place] Name of family member notified: Opal Sidles, daughter Patient and family notified of of transfer: 02/28/20  Discharge Plan and Services     Post Acute Care Choice: [place]                               Social Determinants of Health (SDOH) Interventions     Readmission Risk Interventions No flowsheet data found.

## 2020-02-28 NOTE — Discharge Summary (Signed)
Name: Stephen Shepherd MRN: [id number] DOB: 02/02/1925 86 y.o. PCP: Patient, No Pcp Per  Date of Admission: 02/25/2020 11:49 AM Date of Discharge: 02/28/20 Attending Physician: Dr. Jimmye Norman  Discharge Diagnosis: Principal Problem:   Aspiration pneumonia [facility]) Active Problems:   Protein-calorie malnutrition, severe    Discharge Medications: Allergies as of 02/28/2020   No Known Allergies     Medication List    TAKE these medications   amoxicillin-clavulanate 250-62.5 MG/5ML suspension Commonly known as: AUGMENTIN Take 10 mLs (500 mg total) by mouth 2 (two) times daily for 1 day.   Cholecalciferol 25 MCG (1000 UT) tablet Take 1 tablet (1,000 Units total) by mouth daily.   Entresto 24-26 MG Generic drug: sacubitril-valsartan Take 1 tablet by mouth 2 (two) times daily.   metoprolol succinate 25 MG 24 hr tablet Commonly known as: TOPROL-XL Take 1 tablet (25 mg total) by mouth daily.   Multivitamin Adults 50+ Tabs Take 1 tablet by mouth daily.   simvastatin 40 MG tablet Commonly known as: ZOCOR Take 1 tablet (40 mg total) by mouth daily.   spironolactone 25 MG tablet Commonly known as: ALDACTONE Take 0.5 tablets (12.5 mg total) by mouth daily.       Disposition and follow-up:   Stephen Shepherd was discharged from [facility] in Stable condition.  At the hospital follow up visit please address:  1.  Follow-up:  A. Aspiration pneumonia - finish Augmentin 500 mg/15ml BID for 1 more days.    B. Dysphagia/Protein-caloric malnutrition/Deconditioning - will need thin liquids and dysphagia 2 diet. Will continue to aspirate. Will likely need to keep head of the bed elevated to at least 30-45 degrees. Will need palliative care consult and further goals of care. Will need substantial improvement in diet and PT but will likely still have a difficult time progressing.    C.Prostate Cancer - will need a referral for oncology per patient's request. Will need goals  of care conversation.   2.  Labs / imaging needed at time of follow-up: CBC, CMP  3.  Pending labs/ test needing follow-up: none  Follow-up Appointments:  Contact information for after-discharge care    Destination    [facility] Preferred SNF .   Service: Skilled Nursing Contact information: [address] [place] [facility] Course by problem list:  1. Aspiration pneumonia - Patient presented to [facility] with worsening weakness and difficulty swallowing and found to have an aspiration pneumonia, was treated for 4 days with Augmentin and Azithromycin and discharged with another day of augment to complete a 5 day course. Patient had a speech evaluation showing significant dysphagia requiring thinned liquids and dysphagia 2 diet.    2. Deconditioning/Weakness - continues to have worsening strength requiring assistance with all of his ADLs. He will be discharged to SNF for subacute PT.   3. Adenocarcinoma of the prostate - patient has untreated prostate cancer (Gleason 6) and will need OP follow up with oncology and palliative care for further  goals of care discussion.    Discharge Vitals:   BP 102/64 (BP Location: Left Arm)   Pulse 89   Temp 98.7 F (37.1 C)   Resp 20   Ht 6\' 1"  (1.854 m)   Wt 54.7 kg   SpO2 97%   BMI 15.91 kg/m   Pertinent Labs, Studies, and Procedures:  CBC Latest Ref Rng &  Units 02/28/2020 02/27/2020 02/26/2020  WBC 4.0 - 10.5 K/uL 9.4 6.8 8.6  Hemoglobin 13.0 - 17.0 g/dL 11.5(L) 10.8(L) 11.6(L)  Hematocrit 39 - 52 % 35.7(L) 32.6(L) 36.5(L)  Platelets 150 - 400 K/uL 177 180 202    CMP Latest Ref Rng & Units 02/28/2020 02/26/2020 02/26/2020  Glucose 70 - 99 mg/dL 106(H) - 91  BUN 8 - 23 mg/dL 15 - 30(H)  Creatinine 0.61 - 1.24 mg/dL 0.63 - 0.86  Sodium 135 - 145 mmol/L 139 - 142  Potassium 3.5 - 5.1 mmol/L 3.6 - 4.2  Chloride 98 - 111 mmol/L 106 - 107  CO2 22 - 32 mmol/L 28 - 27  Calcium 8.9  - 10.3 mg/dL 8.1(L) - 8.8(L)  Total Protein 6.5 - 8.1 g/dL 5.1(L) 5.8(L) 5.6(L)  Total Bilirubin 0.3 - 1.2 mg/dL 0.4 1.5(H) 1.3(H)  Alkaline Phos 38 - 126 U/L 51 50 50  AST 15 - 41 U/L 25 25 26   ALT 0 - 44 U/L 21 19 20     DG Chest 2 View  Result Date: 02/25/2020 CLINICAL DATA:  Shortness of breath. EXAM: CHEST - 2 VIEW COMPARISON:  None. FINDINGS: The heart size and mediastinal contours are within normal limits. No pneumothorax or pleural effusion is noted. Sternotomy wires are noted. Hyperinflation of the lungs is noted. Left-sided calcified pleural plaque is noted consistent with prior asbestos exposure. Right lower lobe opacity is noted concerning for pneumonia. The visualized skeletal structures are unremarkable. IMPRESSION: Right lower lobe opacity concerning for pneumonia. Follow-up radiographs are recommended to ensure resolution. Electronically Signed   By: Marijo Conception M.D.   On: 02/25/2020 13:45   DG Swallowing Func-Speech Pathology  Result Date: 02/26/2020 Objective Swallowing Evaluation: Type of Study: MBS-Modified Barium Swallow Study  Patient Details Name: Stephen Shepherd MRN: [id number] Date of Birth: 01/21/1925 Today's Date: 02/26/2020 Time: SLP Start Time (ACUTE ONLY): 1315 -SLP Stop Time (ACUTE ONLY): 1335 SLP Time Calculation (min) (ACUTE ONLY): 20 min Past Medical History: Past Medical History: Diagnosis Date . Stroke [facility])  Past Surgical History: No past surgical history on file. 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  Subjective: pleasant, alert and oriented Assessment / Plan / Recommendation CHL IP CLINICAL IMPRESSIONS 02/26/2020  Clinical Impression Patient presents with a moderate oropharyngeal dysphagia with structural component secondary to significant curvature of cervical vertebrae and resulting in vertebrae pushing into pharynx and causing narrowing of esophagus. Patient with premature spillage of liquids, decreased base of tongue strength, decreased laryngeal elevation and hyolaryngeal excursion as well as impaired airway closure, resulting in silent and sensed trace aspiration during swallow with thin liquids. Puree solids became briefly lodged in vallecular sinus and slowly transited with subsequent swallows and thin liquid sips however not completely cleared from vallecular sinus. Nectar thick liquids resulted in increased vallecular and pyriform sinus residuals as compared to thin liquids. Patient was able to move portion of aspirate out of trachea and out of laryngeal vestibule when just below vocal cords, with throat clear. Patient cannot perform chin tuck do to prior surgery on cervical vertebrae. SLP Visit Diagnosis Dysphagia, oropharyngeal phase (R13.12) Attention and concentration deficit following --  Frontal lobe and executive function deficit following -- Impact on safety and function Moderate aspiration risk   CHL IP TREATMENT RECOMMENDATION 02/26/2020 Treatment Recommendations Therapy as outlined in treatment plan below   Prognosis 02/26/2020 Prognosis for Safe Diet Advancement Fair Barriers to Reach Goals Severity of deficits Barriers/Prognosis Comment -- CHL IP DIET RECOMMENDATION 02/26/2020 SLP Diet Recommendations Thin liquid Liquid Administration via Cup Medication Administration Crushed with puree Compensations Minimize environmental distractions;Slow rate;Small sips/bites;Clear throat intermittently Postural Changes --   CHL IP OTHER RECOMMENDATIONS 02/26/2020 Recommended Consults -- Oral Care Recommendations Oral care BID Other Recommendations --   CHL IP FOLLOW UP RECOMMENDATIONS 02/26/2020 Follow up Recommendations Other  (comment)   CHL IP FREQUENCY AND DURATION 02/26/2020 Speech Therapy Frequency (ACUTE ONLY) min 2x/week Treatment Duration 1 week      CHL IP ORAL PHASE 02/26/2020 Oral Phase Impaired Oral - Pudding Teaspoon -- Oral - Pudding Cup -- Oral - Honey Teaspoon -- Oral - Honey Cup -- Oral - Nectar Teaspoon -- Oral - Nectar Cup Premature spillage Oral - Nectar Straw -- Oral - Thin Teaspoon -- Oral - Thin Cup Premature spillage Oral - Thin Straw Premature spillage Oral - Puree Weak lingual manipulation;Delayed oral transit Oral - Mech Soft -- Oral - Regular -- Oral - Multi-Consistency -- Oral - Pill -- Oral Phase - Comment --  CHL IP PHARYNGEAL PHASE 02/26/2020 Pharyngeal Phase Impaired Pharyngeal- Pudding Teaspoon -- Pharyngeal -- Pharyngeal- Pudding Cup -- Pharyngeal -- Pharyngeal- Honey Teaspoon -- Pharyngeal -- Pharyngeal- Honey Cup -- Pharyngeal -- Pharyngeal- Nectar Teaspoon -- Pharyngeal -- Pharyngeal- Nectar Cup Delayed swallow initiation-vallecula;Reduced tongue base retraction;Reduced airway/laryngeal closure;Penetration/Aspiration during swallow;Pharyngeal residue - valleculae;Pharyngeal residue - pyriform;Pharyngeal residue - posterior pharnyx Pharyngeal Material enters airway, remains ABOVE vocal cords then ejected out Pharyngeal- Nectar Straw -- Pharyngeal -- Pharyngeal- Thin Teaspoon -- Pharyngeal -- Pharyngeal- Thin Cup Delayed swallow initiation-pyriform sinuses;Reduced epiglottic inversion;Reduced laryngeal elevation;Reduced airway/laryngeal closure;Reduced tongue base retraction;Penetration/Aspiration during swallow;Trace aspiration;Pharyngeal residue - valleculae;Pharyngeal residue - pyriform Pharyngeal Material enters airway, passes BELOW cords and not ejected out despite cough attempt by patient Pharyngeal- Thin Straw NT Pharyngeal -- Pharyngeal- Puree Delayed swallow initiation-vallecula;Reduced laryngeal elevation;Reduced tongue base retraction;Pharyngeal residue - valleculae Pharyngeal -- Pharyngeal-  Mechanical Soft -- Pharyngeal -- Pharyngeal- Regular -- Pharyngeal -- Pharyngeal- Multi-consistency -- Pharyngeal -- Pharyngeal- Pill -- Pharyngeal -- Pharyngeal Comment --  CHL IP CERVICAL ESOPHAGEAL PHASE 02/26/2020 Cervical Esophageal Phase WFL Pudding Teaspoon -- Pudding Cup -- Honey Teaspoon -- Honey Cup -- Nectar Teaspoon -- Nectar Cup -- Nectar Straw -- Thin Teaspoon -- Thin Cup -- Thin Straw -- Puree -- Mechanical Soft -- Regular -- Multi-consistency -- Pill -- Cervical Esophageal Comment -- Sonia Baller, MA, CCC-SLP Speech Therapy [facility] Pager: [phone]               Discharge Instructions:   Signed: Marianna Payment, MD 02/28/2020, 1:55 PM   Pager: [phone]

## 2020-02-28 NOTE — Plan of Care (Signed)

## 2020-02-28 NOTE — Discharge Instructions (Signed)
Dear Stephen Shepherd,   Thank you so much for allowing [place] to be part of your care!  You were admitted to [facility] for fall.   POST-HOSPITAL & CARE INSTRUCTIONS 1. Please continue your dysphagia 2 diet at this time. 2. Please let PCP/Specialists know of any changes that were made.  3. Please see medications section of this packet for any medication changes.   DOCTOR'S APPOINTMENT & FOLLOW UP CARE INSTRUCTIONS  No future appointments.  RETURN PRECAUTIONS:   Take care and be well!  Internal [facility]  [address] [place], [place] [address] [phone]

## 2020-02-28 NOTE — Plan of Care (Signed)

## 2020-03-01 LAB — CULTURE, BLOOD (ROUTINE X 2): Culture: NO GROWTH

## 2020-03-02 LAB — CULTURE, BLOOD (ROUTINE X 2)
Culture: NO GROWTH
Special Requests: ADEQUATE

## 2020-03-13 ENCOUNTER — Non-Acute Institutional Stay: Payer: Medicare Other | Admitting: Adult Health Nurse Practitioner

## 2020-03-13 ENCOUNTER — Other Ambulatory Visit: Payer: Self-pay

## 2020-03-13 DIAGNOSIS — Z515 Encounter for palliative care: Secondary | ICD-10-CM

## 2020-03-13 DIAGNOSIS — E43 Unspecified severe protein-calorie malnutrition: Secondary | ICD-10-CM

## 2020-03-14 NOTE — Progress Notes (Signed)
[place] Consult Note Telephone: [phone]  Fax: [phone]  PATIENT NAME: Stephen Shepherd DOB: 04/07/1925 MRN: [id number]  PRIMARY CARE PROVIDER:   Dr. Maryella Shivers  REFERRING PROVIDER: Roddie Mc, PA  RESPONSIBLE PARTY:   Catalina Pizza, daughter [phone]    RECOMMENDATIONS and PLAN:  1.  Advanced care planning.  Patient is full code.  Patient voices that he does want CPR done if his heart were to stop beating.  Daughter states that he has voiced to her as well that he wants full interventions.  2.  Functional status.  Daughter states that her father was driving 2 months ago and now he is requiring assistance with ADLs.  Does state that he has been working with therapy while at [facility] and states that he has been able to walk with assistance and a walker and is able to go to the bathroom and has been feeding himself.  Is continent of bowel and bladder.  Patient does voice that he feels weak.  Continue rehab as ordered and supportive care at facility.  3.  Nutritional status.  Patient voices that he has a fair appetite.  Does have some coughing with eating.  On aspiration precautions.  Patient working with [place] and RD at facility.  Current weight is 123 pounds with BMI of 16.68.  Continue follow-up and recommendations by ST and RD.  Spoke with daughter about possibility for hospice with her father's physical decline and weight loss.  She expresses understanding and also wants to follow her dad's wishes.  States that her dad would see hospice as giving up and he is not ready for this.  States that they had a care plan meeting at the facility and that estimated discharge date is March 22, 2020.  Did not want to continue with palliative care once he returns home as they do have a lot of providers coming into the home for her father and her mother.  Left her my contact information if she felt he needed another visit prior to his discharge from  the facility and that if anything changes once he goes home that she could contact me and we can get palliative services started again.   I spent 40 minutes providing this consultation,  from 10:30 to 11:10 including time spent with patient/family, chart review, provider coordination, documentation. More than 50% of the time in this consultation was spent coordinating communication.   HISTORY OF PRESENT ILLNESS:  Stephen Shepherd is a 86 y.o. year old male with multiple medical problems including HF class III, CAD, MI s/p CABG x4 (2004), LVEF 37%, HTN, HLD, PAD, prostate cancer. Palliative Care was asked to help address goals of care. Patient had been living in [place], [place] with his wife.  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  Daughter states that he was not eating like he should and family had concerns that they were not able to take care of themselves without assistance.  Have moved to [place], [place] to be closer to their daughter and have an apartment at [employer]  They have been there for about 5 weeks when patient had to be hospitalized for aspiration pneumonia 9/5-02/28/20.  Daughter does state that he has had some dysphagia after having a stroke 30 years ago.  It has slowly progressed and has gotten worse.  Denies pain, headaches, dizziness, shortness of breath, chest pain, N/V/D, constipation, dysuria, hematuria.  CODE STATUS: full code  PPS: 40% HOSPICE ELIGIBILITY/DIAGNOSIS: TBD  PHYSICAL EXAM:  HR 70 O2 100% on room air General: NAD, frail appearing, thin Cardiovascular: regular rate and rhythm Pulmonary: clear ant fields; normal respiratory effort Abdomen: soft, nontender, + bowel sounds GU: no suprapubic tenderness Extremities: no edema, no joint deformities Skin: no rashes on exposed skin Neurological: Weakness but otherwise nonfocal  PAST MEDICAL HISTORY:  Past Medical History:  Diagnosis Date  . Stroke [facility])     SOCIAL HX:  Social History   Tobacco Use  . Smoking status:  Not on file  Substance Use Topics  . Alcohol use: Not Currently    ALLERGIES: No Known Allergies   PERTINENT MEDICATIONS:  Outpatient Encounter Medications as of 03/13/2020  Medication Sig  . Cholecalciferol 25 MCG (1000 UT) tablet Take 1 tablet (1,000 Units total) by mouth daily.  Marland Kitchen ENTRESTO 24-26 MG Take 1 tablet by mouth 2 (two) times daily.  . metoprolol succinate (TOPROL-XL) 25 MG 24 hr tablet Take 1 tablet (25 mg total) by mouth daily.  . Multiple Vitamins-Minerals (MULTIVITAMIN ADULTS 50+) TABS Take 1 tablet by mouth daily.  . simvastatin (ZOCOR) 40 MG tablet Take 1 tablet (40 mg total) by mouth daily.  Marland Kitchen spironolactone (ALDACTONE) 25 MG tablet Take 0.5 tablets (12.5 mg total) by mouth daily.   No facility-administered encounter medications on file as of 03/13/2020.      Shandricka Monroy Jenetta Downer, NP

## 2020-03-17 ENCOUNTER — Other Ambulatory Visit: Payer: Self-pay

## 2020-03-17 ENCOUNTER — Emergency Department (HOSPITAL_COMMUNITY)
Admission: EM | Admit: 2020-03-17 | Discharge: 2020-03-17 | Disposition: A | Discharge status: Home / Self Care | Payer: Medicare Other | Attending: Emergency Medicine | Admitting: Emergency Medicine

## 2020-03-17 ENCOUNTER — Emergency Department (HOSPITAL_COMMUNITY): Payer: Medicare Other

## 2020-03-17 ENCOUNTER — Encounter (HOSPITAL_COMMUNITY): Payer: Self-pay

## 2020-03-17 DIAGNOSIS — Y9289 Other specified places as the place of occurrence of the external cause: Secondary | ICD-10-CM | POA: Diagnosis not present

## 2020-03-17 DIAGNOSIS — Y9389 Activity, other specified: Secondary | ICD-10-CM | POA: Diagnosis not present

## 2020-03-17 DIAGNOSIS — S51011A Laceration without foreign body of right elbow, initial encounter: Secondary | ICD-10-CM

## 2020-03-17 DIAGNOSIS — S0101XA Laceration without foreign body of scalp, initial encounter: Secondary | ICD-10-CM | POA: Diagnosis not present

## 2020-03-17 DIAGNOSIS — I10 Essential (primary) hypertension: Secondary | ICD-10-CM | POA: Insufficient documentation

## 2020-03-17 DIAGNOSIS — W228XXA Striking against or struck by other objects, initial encounter: Secondary | ICD-10-CM | POA: Insufficient documentation

## 2020-03-17 DIAGNOSIS — S0990XA Unspecified injury of head, initial encounter: Secondary | ICD-10-CM | POA: Diagnosis present

## 2020-03-17 MED ORDER — BACITRACIN ZINC 500 UNIT/GM EX OINT
1.0000 "application " | TOPICAL_OINTMENT | Freq: Two times a day (BID) | CUTANEOUS | Status: DC
  Administered 2020-03-17: 1 via TOPICAL
  Filled 2020-03-17: qty 0.9

## 2020-03-17 MED ORDER — LIDOCAINE-EPINEPHRINE-TETRACAINE (LET) TOPICAL GEL
6.0000 mL | Freq: Once | TOPICAL | Status: AC
  Administered 2020-03-17: 6 mL via TOPICAL
  Filled 2020-03-17: qty 6

## 2020-03-17 MED ORDER — BACITRACIN ZINC 500 UNIT/GM EX OINT: 1.0000 "application " | TOPICAL_OINTMENT | Freq: Two times a day (BID) | CUTANEOUS | 0 refills | Status: AC

## 2020-03-17 MED ORDER — LIDOCAINE-EPINEPHRINE-TETRACAINE (LET) SOLUTION: 3.0000 mL | Freq: Once | NASAL | Status: DC

## 2020-03-17 NOTE — ED Provider Notes (Signed)
[place] EMERGENCY DEPARTMENT Provider Note   CSN: [id number] Arrival date & time: 03/17/20  1355     History Chief Complaint  Patient presents with  . Fall    Stephen Shepherd is a 86 y.o. male.  HPI   This patient is a 86 year old male, he has medical problems including a recent admission to the hospital for aspiration pneumonia, history of severe malnutrition, hyperlipidemia hypertension and a prior stroke.  He presents today from that nursing facility after having an unwitnessed fall, the patient had rolled out of bed apparently striking his head on something, I spoke with the daughter who states that they have the bedside on the low setting so that if he did follow would be very low to the ground.  There was no witness seizure activity nausea or vomiting and the patient actually has no complaints at this time.  He did have some complaints of right shoulder pain prehospital and does have evidence of a skin tear to the right elbow however the patient's lack of memory of events limits further evaluation from a historical standpoint.  The patient is not anticoagulated.  I spoke with the daughter at length, she states that the patient would like to be a full code but agrees that he is a nonsurgical candidate and agrees that a CT scan would not be necessary to further evaluate his head or cervical spine.  The patient does not have complaints of headache or spine pain  Past Medical History:  Diagnosis Date  . Stroke [facility])     Patient Active Problem List   Diagnosis Date Noted  . Protein-calorie malnutrition, severe 02/26/2020  . Aspiration pneumonia ([place]) 02/25/2020    History reviewed. No pertinent surgical history.     History reviewed. No pertinent family history.  Social History   Tobacco Use  . Smoking status: Never Smoker  . Smokeless tobacco: Never Used  Substance Use Topics  . Alcohol use: Not Currently  . Drug use: Never    Home  Medications Prior to Admission medications   Medication Sig Start Date End Date Taking? Authorizing Provider  bacitracin ointment Apply 1 application topically 2 (two) times daily. 03/17/20   Noemi Chapel, MD  Cholecalciferol 25 MCG (1000 UT) tablet Take 1 tablet (1,000 Units total) by mouth daily. 02/28/20 03/29/20  Marianna Payment, MD  ENTRESTO 24-26 MG Take 1 tablet by mouth 2 (two) times daily. 02/28/20 03/29/20  Marianna Payment, MD  metoprolol succinate (TOPROL-XL) 25 MG 24 hr tablet Take 1 tablet (25 mg total) by mouth daily. 02/28/20 03/29/20  Marianna Payment, MD  Multiple Vitamins-Minerals (MULTIVITAMIN ADULTS 50+) TABS Take 1 tablet by mouth daily. 02/28/20 03/29/20  Marianna Payment, MD  simvastatin (ZOCOR) 40 MG tablet Take 1 tablet (40 mg total) by mouth daily. 02/28/20 03/29/20  Marianna Payment, MD  spironolactone (ALDACTONE) 25 MG tablet Take 0.5 tablets (12.5 mg total) by mouth daily. 02/28/20 03/29/20  Marianna Payment, MD    Allergies    Patient has no known allergies.  Review of Systems   Review of Systems  All other systems reviewed and are negative.   Physical Exam Updated Vital Signs BP 128/78 (BP Location: Left Arm)   Pulse 84   Temp 98 F (36.7 C) (Axillary)   Resp 16   SpO2 99%   Physical Exam Vitals and nursing note reviewed.  Constitutional:      General: He is not in acute distress.    Appearance: He is well-developed.  HENT:     Head: Normocephalic.     Comments: 3 cm laceration to the left temporoparietal scalp, nonbleeding    Mouth/Throat:     Pharynx: No oropharyngeal exudate.  Eyes:     General: No scleral icterus.       Right eye: No discharge.        Left eye: No discharge.     Conjunctiva/sclera: Conjunctivae normal.     Pupils: Pupils are equal, round, and reactive to light.  Neck:     Thyroid: No thyromegaly.     Vascular: No JVD.  Cardiovascular:     Rate and Rhythm: Normal rate and regular rhythm.     Heart sounds: Normal heart sounds. No murmur heard.  No  friction rub. No gallop.   Pulmonary:     Effort: Pulmonary effort is normal. No respiratory distress.     Breath sounds: Normal breath sounds. No wheezing or rales.  Abdominal:     General: Bowel sounds are normal. There is no distension.     Palpations: Abdomen is soft. There is no mass.     Tenderness: There is no abdominal tenderness.  Musculoskeletal:        General: Tenderness present. Normal range of motion.     Cervical back: Normal range of motion and neck supple.     Comments: Mild tenderness with moving right shoulder and right elbow, no crepitance or obvious deformity  Lymphadenopathy:     Cervical: No cervical adenopathy.  Skin:    General: Skin is warm and dry.     Findings: No erythema or rash.     Comments: Skin tear over the right elbow and the left wrist, laceration of the scalp as described  Neurological:     Mental Status: He is alert.     Coordination: Coordination normal.     Comments: Able to move all 4 extremities with baseline strength.  Follows commands, he knows that he is in the hospital, he is able to answer basic questions  Psychiatric:        Behavior: Behavior normal.     ED Results / Procedures / Treatments   Labs (all labs ordered are listed, but only abnormal results are displayed) Labs Reviewed - No data to display  EKG None  Radiology DG Shoulder Right  Result Date: 03/17/2020 CLINICAL DATA:  Recent fall with shoulder pain, initial encounter EXAM: RIGHT SHOULDER - 2+ VIEW COMPARISON:  None. FINDINGS: Humeral head is high-riding consistent with a chronic rotator cuff injury. Degenerative changes of the acromioclavicular joint are seen. No acute fracture or dislocation is noted. Underlying bony thorax appears within normal limits. IMPRESSION: Changes consistent with chronic rotator cuff injury. Degenerative change without acute bony abnormality. Electronically Signed   By: Inez Catalina M.D.   On: 03/17/2020 15:01   DG ELBOW COMPLETE RIGHT  (3+VIEW)  Result Date: 03/17/2020 CLINICAL DATA:  Recent fall with right elbow pain, initial encounter EXAM: RIGHT ELBOW - COMPLETE 3+ VIEW COMPARISON:  None. FINDINGS: Postsurgical changes are noted in the distal humerus. No hardware failure is noted. No acute fracture or dislocation is seen. No joint effusion is noted. IMPRESSION: Postsurgical change in the distal humerus. No acute abnormality noted. Electronically Signed   By: Inez Catalina M.D.   On: 03/17/2020 15:03    Procedures .Marland KitchenLaceration Repair  Date/Time: 03/17/2020 3:17 PM Performed by: Noemi Chapel, MD Authorized by: Noemi Chapel, MD   Consent:    Consent obtained:  Verbal  Consent given by:  Patient   Risks discussed:  Infection, pain, need for additional repair, poor cosmetic result and poor wound healing   Alternatives discussed:  No treatment and delayed treatment Anesthesia (see MAR for exact dosages):    Anesthesia method:  Topical application   Topical anesthetic:  LET Laceration details:    Location:  Scalp   Scalp location:  L temporal   Length (cm):  3   Depth (mm):  3 Repair type:    Repair type:  Simple Pre-procedure details:    Preparation:  Patient was prepped and draped in usual sterile fashion and imaging obtained to evaluate for foreign bodies Exploration:    Hemostasis achieved with:  Direct pressure   Wound exploration: wound explored through full range of motion and entire depth of wound probed and visualized     Wound extent: no fascia violation noted, no foreign bodies/material noted, no muscle damage noted, no nerve damage noted, no tendon damage noted, no underlying fracture noted and no vascular damage noted   Treatment:    Area cleansed with:  Betadine   Amount of cleaning:  Standard   Irrigation solution:  Sterile saline   Irrigation method:  Syringe Skin repair:    Repair method:  Staples   Number of staples:  2 Approximation:    Approximation:  Close Post-procedure details:     Dressing:  Antibiotic ointment and sterile dressing   Patient tolerance of procedure:  Tolerated well, no immediate complications Comments:         (including critical care time)  Medications Ordered in ED Medications  bacitracin ointment 1 application (1 application Topical Given 03/17/20 1427)  lidocaine-EPINEPHrine-tetracaine (LET) topical gel (6 mLs Topical Given 03/17/20 1425)    ED Course  I have reviewed the triage vital signs and the nursing notes.  Pertinent labs & imaging results that were available during my care of the patient were reviewed by me and considered in my medical decision making (see chart for details).    MDM Rules/Calculators/A&P                          We will avoid CT scan imaging as a would seem that the patient is not a interventional surgical candidate if there was to be an poor outcome.  The patient's daughter agrees and requests no CT scan be done.  Functionally the patient will need to have a shoulder and elbow imaged to make sure there is no fractures as he needs to use his arm to help him eat.  His mental status is awake, his vital signs are unremarkable.  We will close the laceration by primary repair  Wound repaired without any difficulty, patient's x-rays show no fractures of the elbow or the shoulder, he does have chronic arthritis.  The patient will be stable for discharge and is comfortable at this time  Final Clinical Impression(s) / ED Diagnoses Final diagnoses:  Laceration of scalp, initial encounter  Skin tear of right elbow without complication, initial encounter    Rx / DC Orders ED Discharge Orders         Ordered    bacitracin ointment  2 times daily        03/17/20 1516           Noemi Chapel, MD 03/17/20 [id number]

## 2020-03-17 NOTE — Discharge Instructions (Signed)
It required 2 staples to fix your laceration.  Make sure that your doctor takes these out within 10 days.  If you develop increasing pain swelling redness fever or pus from the wound come back to the hospital immediately.  You may also have your family doctor evaluate this if you develop signs of infection.  Tylenol or ibuprofen for pain, apply topical antibiotic ointment to the wound.

## 2020-03-17 NOTE — ED Notes (Signed)
Patient transported to X-ray 

## 2020-03-17 NOTE — ED Triage Notes (Signed)
Pt BIB [facility] from [facility]. Pt was sitting up in his bed eating when he slipped out of the bed landing in the floor. Pt has a lac to Left side forehead, skin tear Right elbow and Left wrist.  BP 116/62 HR 60 RR 16 98.2

## 2020-04-22 DEATH — deceased

## 2022-01-22 IMAGING — DX DG SHOULDER 2+V*R*
2 series · 2 of 2 positions shown · non-contrast
Comparison: None.

CLINICAL DATA: Recent fall with shoulder pain, initial encounter

EXAM:
RIGHT SHOULDER - 2+ VIEW

[shoulder grashey]
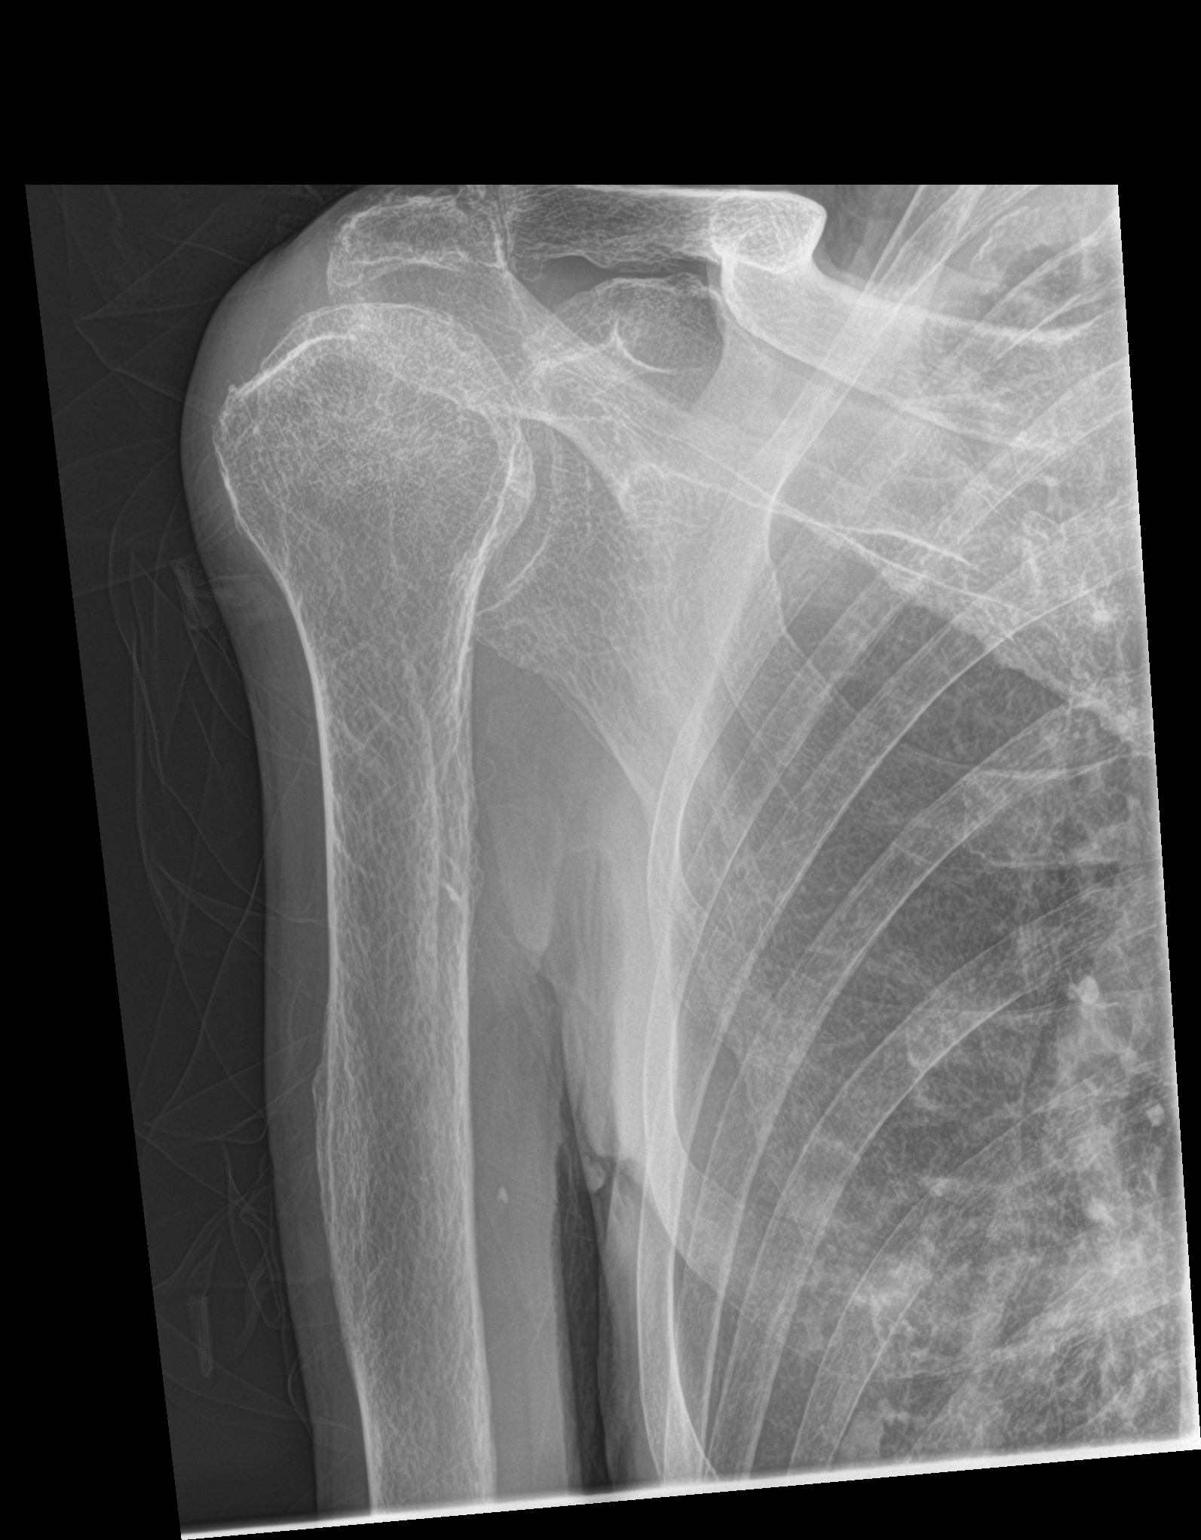

[shoulder y view]
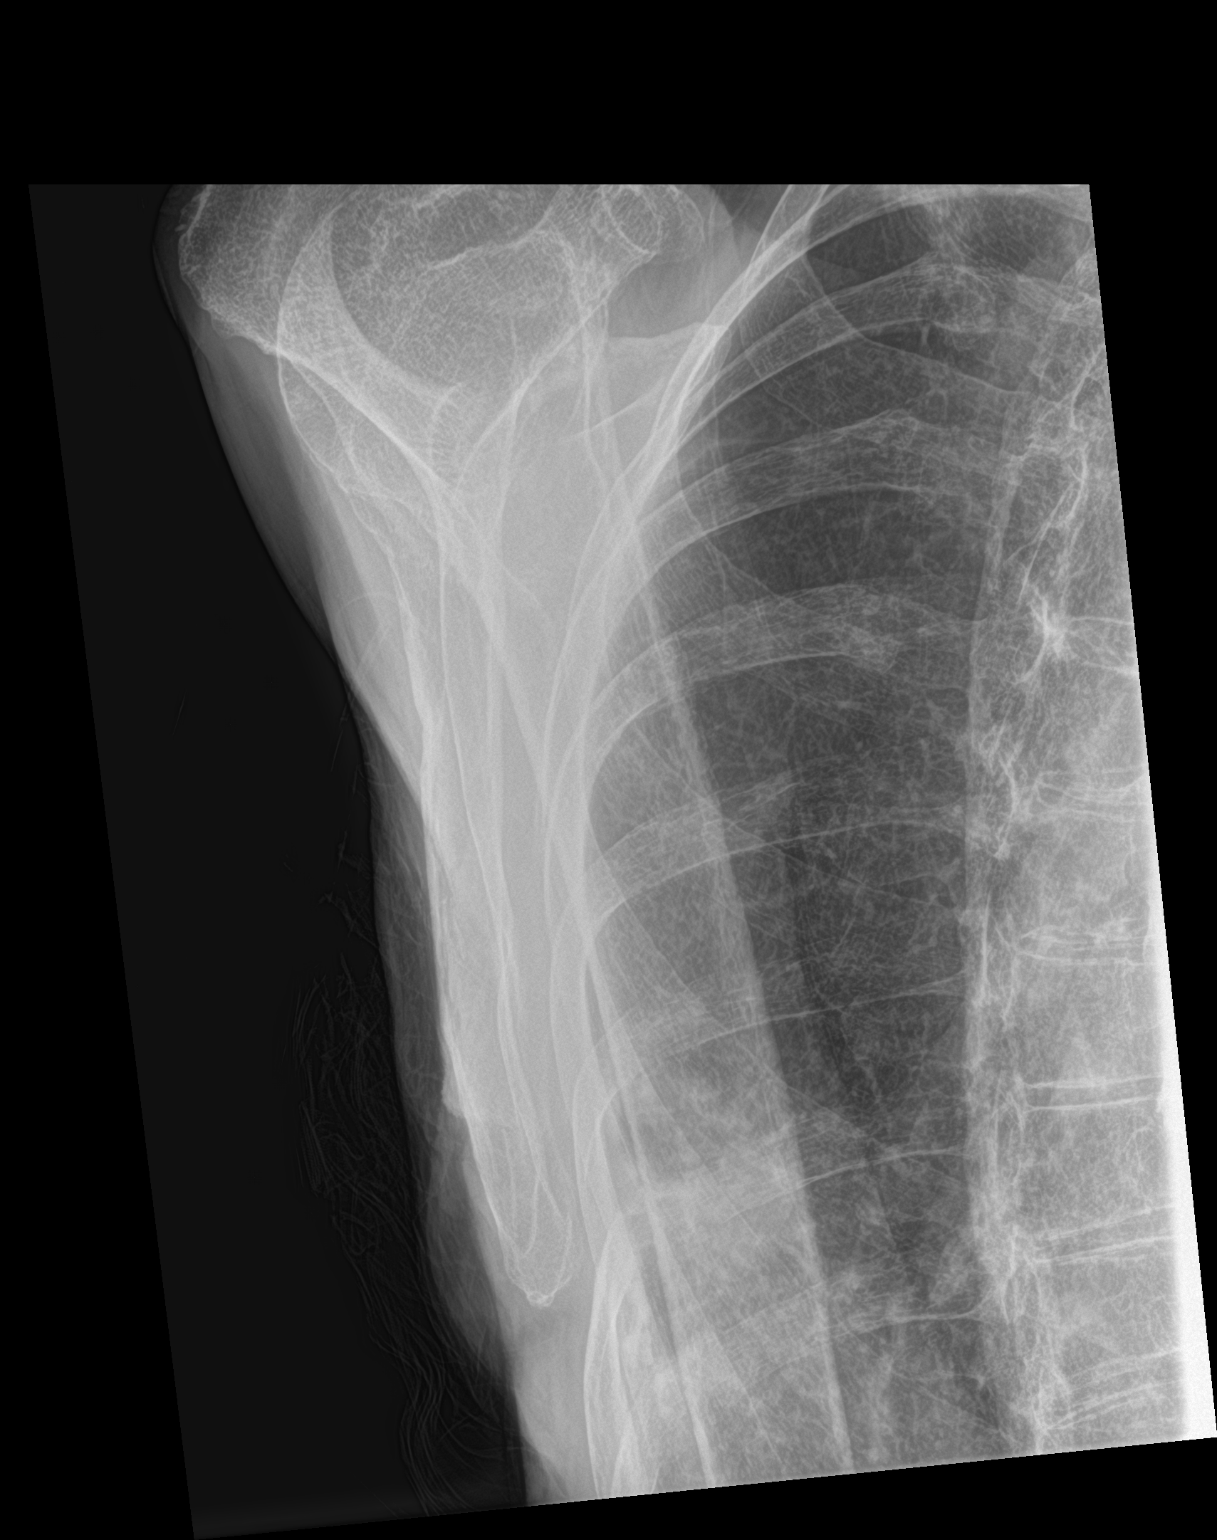

[2 of 2 positions shown; findings below may reference images not displayed]

FINDINGS: Humeral head is high-riding consistent with a chronic rotator cuff
injury. Degenerative changes of the acromioclavicular joint are
seen. No acute fracture or dislocation is noted. Underlying bony
thorax appears within normal limits.
IMPRESSION: Changes consistent with chronic rotator cuff injury.

Degenerative change without acute bony abnormality.
